# Patient Record
Sex: Female | Born: 1935 | Race: White | Hispanic: No | Smoking: Never smoker
Health system: Southern US, Community
[De-identification: ages and names within clinical notes are randomized; demographics above are authoritative.]

## PROBLEM LIST (undated history)

## (undated) DIAGNOSIS — M1990 Unspecified osteoarthritis, unspecified site: Secondary | ICD-10-CM

## (undated) DIAGNOSIS — R413 Other amnesia: Secondary | ICD-10-CM

## (undated) DIAGNOSIS — I639 Cerebral infarction, unspecified: Secondary | ICD-10-CM

## (undated) DIAGNOSIS — H3530 Unspecified macular degeneration: Secondary | ICD-10-CM

## (undated) DIAGNOSIS — M810 Age-related osteoporosis without current pathological fracture: Secondary | ICD-10-CM

## (undated) DIAGNOSIS — I4891 Unspecified atrial fibrillation: Secondary | ICD-10-CM

## (undated) DIAGNOSIS — I2510 Atherosclerotic heart disease of native coronary artery without angina pectoris: Secondary | ICD-10-CM

## (undated) DIAGNOSIS — I10 Essential (primary) hypertension: Secondary | ICD-10-CM

## (undated) DIAGNOSIS — R4701 Aphasia: Secondary | ICD-10-CM

## (undated) DIAGNOSIS — E785 Hyperlipidemia, unspecified: Secondary | ICD-10-CM

## (undated) DIAGNOSIS — I700 Atherosclerosis of aorta: Secondary | ICD-10-CM

## (undated) DIAGNOSIS — R32 Unspecified urinary incontinence: Secondary | ICD-10-CM

## (undated) DIAGNOSIS — K219 Gastro-esophageal reflux disease without esophagitis: Secondary | ICD-10-CM

## (undated) HISTORY — DX: Atherosclerotic heart disease of native coronary artery without angina pectoris: I25.10

## (undated) HISTORY — DX: Other amnesia: R41.3

## (undated) HISTORY — DX: Hyperlipidemia, unspecified: E78.5

## (undated) HISTORY — DX: Atherosclerosis of aorta: I70.0

## (undated) HISTORY — DX: Essential (primary) hypertension: I10

## (undated) HISTORY — DX: Age-related osteoporosis without current pathological fracture: M81.0

## (undated) HISTORY — DX: Unspecified osteoarthritis, unspecified site: M19.90

## (undated) HISTORY — DX: Unspecified macular degeneration: H35.30

## (undated) HISTORY — PX: TONSILLECTOMY: SUR1361

## (undated) HISTORY — DX: Cerebral infarction, unspecified: I63.9

## (undated) HISTORY — DX: Gastro-esophageal reflux disease without esophagitis: K21.9

## (undated) HISTORY — DX: Unspecified atrial fibrillation: I48.91

## (undated) HISTORY — DX: Aphasia: R47.01

## (undated) HISTORY — DX: Unspecified urinary incontinence: R32

## (undated) HISTORY — PX: OTHER SURGICAL HISTORY: SHX169

---

## 2003-12-26 ENCOUNTER — Encounter: Payer: Self-pay | Admitting: Cardiology

## 2014-02-08 DIAGNOSIS — I639 Cerebral infarction, unspecified: Secondary | ICD-10-CM

## 2014-02-08 HISTORY — DX: Cerebral infarction, unspecified: I63.9

## 2019-06-24 ENCOUNTER — Other Ambulatory Visit: Payer: Self-pay | Admitting: Family Medicine

## 2019-06-24 DIAGNOSIS — Z1231 Encounter for screening mammogram for malignant neoplasm of breast: Secondary | ICD-10-CM

## 2019-08-18 NOTE — Progress Notes (Signed)
Referring-Shannon Masneri DO Reason for referral-dyspnea  HPI: 85 year old female for evaluation of dyspnea at request of Crissie Sickles DO.  Patient previously lived in [place] and then [place].  I have no records available.  She apparently has a history of atrial fibrillation.  She denies any symptoms but her son states that she has diaphoresis and dyspnea with activities.  No orthopnea, PND, pedal edema, chest pain, palpitations, syncope or bleeding.  Current Outpatient Medications  Medication Sig Dispense Refill  . aspirin EC 81 MG tablet Take 81 mg by mouth daily.    Marland Kitchen latanoprost (XALATAN) 0.005 % ophthalmic solution     . metoprolol tartrate (LOPRESSOR) 50 MG tablet Take 50 mg by mouth 2 (two) times daily.    . Omega-3 Fatty Acids (FISH OIL) 1000 MG CAPS Take by mouth.    Marland Kitchen omeprazole (PRILOSEC) 20 MG capsule Take 20 mg by mouth daily.    Marland Kitchen oxybutynin (DITROPAN) 5 MG tablet     . simvastatin (ZOCOR) 40 MG tablet Take 40 mg by mouth at bedtime.    . timolol (TIMOPTIC) 0.5 % ophthalmic solution 1 drop 2 (two) times daily.    . vitamin B-12 (CYANOCOBALAMIN) 100 MCG tablet Take 100 mcg by mouth daily.    . vitamin E (VITAMIN E) 1000 UNIT capsule Take 1,000 Units by mouth daily.    Alveda Reasons 15 MG TABS tablet Take 15 mg by mouth daily.     No current facility-administered medications for this visit.    Allergies  Allergen Reactions  . Iodine Hives     Past Medical History:  Diagnosis Date  . Atrial fibrillation ([place])   . CVA (cerebral vascular accident) ([place])   . Hypertension     Past Surgical History:  Procedure Laterality Date  . TONSILLECTOMY    . Varicose veins      Social History   Socioeconomic History  . Marital status: Divorced    Spouse name: Not on file  . Number of children: 10  . Years of education: Not on file  . Highest education level: Not on file  Occupational History  . Not on file  Tobacco Use  . Smoking status: Never Smoker  .  Smokeless tobacco: Never Used  Substance and Sexual Activity  . Alcohol use: Yes    Comment: Rare  . Drug use: Not on file  . Sexual activity: Not on file  Other Topics Concern  . Not on file  Social History Narrative  . Not on file   Social Determinants of Health   Financial Resource Strain:   . Difficulty of Paying Living Expenses: Not on file  Food Insecurity:   . Worried About Charity fundraiser in the Last Year: Not on file  . Ran Out of Food in the Last Year: Not on file  Transportation Needs:   . Lack of Transportation (Medical): Not on file  . Lack of Transportation (Non-Medical): Not on file  Physical Activity:   . Days of Exercise per Week: Not on file  . Minutes of Exercise per Session: Not on file  Stress:   . Feeling of Stress : Not on file  Social Connections:   . Frequency of Communication with Friends and Family: Not on file  . Frequency of Social Gatherings with Friends and Family: Not on file  . Attends Religious Services: Not on file  . Active Member of Clubs or Organizations: Not on file  . Attends Archivist Meetings:  Not on file  . Marital Status: Not on file  Intimate Partner Violence:   . Fear of Current or Ex-Partner: Not on file  . Emotionally Abused: Not on file  . Physically Abused: Not on file  . Sexually Abused: Not on file    Family History  Problem Relation Age of Onset  . Stroke Father     ROS: no fevers or chills, productive cough, hemoptysis, dysphasia, odynophagia, melena, hematochezia, dysuria, hematuria, rash, seizure activity, orthopnea, PND, pedal edema, claudication. Remaining systems are negative.  Physical Exam:   Blood pressure (!) 141/70, pulse (!) 42, temperature (!) 97 F (36.1 C), height 5' 5.5" (1.664 m), weight 187 lb (84.8 kg), SpO2 95 %.  General:  Well developed/well nourished in NAD Skin warm/dry Patient not depressed No peripheral clubbing Back-normal HEENT-normal/normal eyelids Neck  supple/normal carotid upstroke bilaterally; no bruits; no JVD; no thyromegaly chest - CTA/ normal expansion CV -irregular and bradycardic/normal S1 and S2; no murmurs, rubs or gallops;  PMI nondisplaced Abdomen -NT/ND, no HSM, no mass, + bowel sounds, no bruit 2+ femoral pulses, no bruits Ext-no edema, chords, 2+ DP Neuro-grossly nonfocal  ECG -atrial fibrillation, left axis deviation, cannot rule out septal infarct, occasional PVC or ventricular escape beat.  Personally reviewed  A/P  1 dyspnea-patient has had some dyspnea on exertion per her son's report.  We will arrange an echocardiogram to assess LV function.  2 atrial fibrillation-duration unknown but likely permanent.  We will obtain all records from her previous cardiologist in [place].  Her heart rate is slow today and in the 40s.  Decrease metoprolol to 25 mg twice daily for 3 days then discontinue.  I will then arrange a 24-hour Holter monitor to assess heart rate control.  Continue Xarelto.  Check hemoglobin and renal function.  Note patient is also on aspirin.  We will review outside records to see if this is indicated.  3 hypertension-blood pressure mildly elevated.  We are also discontinuing metoprolol.  I will add amlodipine 5 mg daily once metoprolol has been discontinued.  Follow blood pressure and adjust regimen as needed.  4 hyperlipidemia-continue statin.  Check lipids and liver.  Kirk Ruths, MD

## 2019-08-24 ENCOUNTER — Encounter (INDEPENDENT_AMBULATORY_CARE_PROVIDER_SITE_OTHER): Payer: Self-pay

## 2019-08-24 ENCOUNTER — Other Ambulatory Visit: Payer: Self-pay

## 2019-08-24 ENCOUNTER — Ambulatory Visit: Payer: Self-pay | Admitting: Cardiology

## 2019-08-24 ENCOUNTER — Encounter: Payer: Self-pay | Admitting: Cardiology

## 2019-08-24 VITALS — BP 141/70 | HR 42 | Temp 97.0°F | Ht 65.5 in | Wt 187.0 lb

## 2019-08-24 DIAGNOSIS — R0609 Other forms of dyspnea: Secondary | ICD-10-CM

## 2019-08-24 DIAGNOSIS — E7800 Pure hypercholesterolemia, unspecified: Secondary | ICD-10-CM | POA: Diagnosis not present

## 2019-08-24 DIAGNOSIS — I4821 Permanent atrial fibrillation: Secondary | ICD-10-CM

## 2019-08-24 DIAGNOSIS — R0600 Dyspnea, unspecified: Secondary | ICD-10-CM | POA: Diagnosis not present

## 2019-08-24 DIAGNOSIS — I10 Essential (primary) hypertension: Secondary | ICD-10-CM | POA: Diagnosis not present

## 2019-08-24 LAB — BASIC METABOLIC PANEL
BUN/Creatinine Ratio: 24 (ref 12–28)
BUN: 21 mg/dL (ref 8–27)
CO2: 25 mmol/L (ref 20–29)
Calcium: 9.6 mg/dL (ref 8.7–10.3)
Chloride: 100 mmol/L (ref 96–106)
Creatinine, Ser: 0.88 mg/dL (ref 0.57–1.00)
GFR calc Af Amer: 68 mL/min/{1.73_m2} (ref >59)
GFR calc non Af Amer: 59 mL/min/{1.73_m2} — ABNORMAL LOW (ref >59)
Glucose: 111 mg/dL — ABNORMAL HIGH (ref 65–99)
Potassium: 5.3 mmol/L — ABNORMAL HIGH (ref 3.5–5.2)
Sodium: 139 mmol/L (ref 134–144)

## 2019-08-24 LAB — HEPATIC FUNCTION PANEL
ALT: 17 IU/L (ref 0–32)
AST: 21 IU/L (ref 0–40)
Albumin: 4.1 g/dL (ref 3.6–4.6)
Alkaline Phosphatase: 79 IU/L (ref 39–117)
Bilirubin Total: 0.6 mg/dL (ref 0.0–1.2)
Bilirubin, Direct: 0.19 mg/dL (ref 0.00–0.40)
Total Protein: 6.1 g/dL (ref 6.0–8.5)

## 2019-08-24 LAB — LIPID PANEL
Chol/HDL Ratio: 2.5 ratio (ref 0.0–4.4)
Cholesterol, Total: 151 mg/dL (ref 100–199)
HDL: 61 mg/dL (ref >39)
LDL Chol Calc (NIH): 66 mg/dL (ref 0–99)
Triglycerides: 143 mg/dL (ref 0–149)
VLDL Cholesterol Cal: 24 mg/dL (ref 5–40)

## 2019-08-24 LAB — CBC
Hematocrit: 42.8 % (ref 34.0–46.6)
Hemoglobin: 14.8 g/dL (ref 11.1–15.9)
MCH: 31 pg (ref 26.6–33.0)
MCHC: 34.6 g/dL (ref 31.5–35.7)
MCV: 90 fL (ref 79–97)
Platelets: 327 10*3/uL (ref 150–450)
RBC: 4.78 x10E6/uL (ref 3.77–5.28)
RDW: 12.3 % (ref 11.7–15.4)
WBC: 8.6 10*3/uL (ref 3.4–10.8)

## 2019-08-24 MED ORDER — AMLODIPINE BESYLATE 5 MG PO TABS: 5.0000 mg | ORAL_TABLET | Freq: Every day | ORAL | 3 refills | Status: DC

## 2019-08-24 NOTE — Patient Instructions (Signed)
Medication Instructions:  DECREASE METOPROLOL TO 25 MG TWICE DAILY FOR 3 DAYS AND THEN STOP= 1/2 OF THE 50 MG TABLET TWICE DAILY FOR 3 DAYS AND THEN STOP  START AMLODIPINE 5 MG ONCE DAILY AFTER METOPROLOL STOPPED  *If you need a refill on your cardiac medications before your next appointment, please call your pharmacy*  Lab Work: Your physician recommends that you HAVE LAB WORK TODAY  If you have labs (blood work) drawn today and your tests are completely normal, you will receive your results only by: Marland Kitchen MyChart Message (if you have MyChart) OR . A paper copy in the mail If you have any lab test that is abnormal or we need to change your treatment, we will call you to review the results.  Testing/Procedures: Your physician has requested that you have an echocardiogram. Echocardiography is a painless test that uses sound waves to create images of your heart. It provides your doctor with information about the size and shape of your heart and how well your heart's chambers and valves are working. This procedure takes approximately one hour. There are no restrictions for this procedure.AT THE [facility]   ZIO XT- Long Term Monitor Instructions   Your physician has requested you wear your ZIO patch monitor__24_____HOURS. MAIL IN 2 WEEKS   This is a single patch monitor.  [employer] supplies one patch monitor per enrollment.  Additional stickers are not available.   Please do not apply patch if you will be having a Nuclear Stress Test, Echocardiogram, Cardiac CT, MRI, or Chest Xray during the time frame you would be wearing the monitor. The patch cannot be worn during these tests.  You cannot remove and re-apply the ZIO XT patch monitor.   Your ZIO patch monitor will be sent USPS Priority mail from [facility] directly to your home address. The monitor may also be mailed to a PO BOX if home delivery is not available.   It may take 3-5 days to receive your monitor after you have been  enrolled.   Once you have received you monitor, please review enclosed instructions.  Your monitor has already been registered assigning a specific monitor serial # to you.   Applying the monitor   Shave hair from upper left chest.   Hold abrader disc by orange tab.  Rub abrader in 40 strokes over left upper chest as indicated in your monitor instructions.   Clean area with 4 enclosed alcohol pads .  Use all pads to assure are is cleaned thoroughly.  Let dry.   Apply patch as indicated in monitor instructions.  Patch will be place under collarbone on left side of chest with arrow pointing upward.   Rub patch adhesive wings for 2 minutes.Remove white label marked "1".  Remove white label marked "2".  Rub patch adhesive wings for 2 additional minutes.   While looking in a mirror, press and release button in center of patch.  A small green light will flash 3-4 times .  This will be your only indicator the monitor has been turned on.     Do not shower for the first 24 hours.  You may shower after the first 24 hours.   Press button if you feel a symptom. You will hear a small click.  Record Date, Time and Symptom in the Patient Log Book.   When you are ready to remove patch, follow instructions on last 2 pages of Patient Log Book.  Stick patch monitor onto last page  of Patient Log Book.   Place Patient Log Book in [place] box.  Use locking tab on box and tape box closed securely.  The [employer] and [employer] has [employer] on it.  Please place in mailbox as soon as possible.  Your physician should have your test results approximately 7 days after the monitor has been mailed back to [facility].   Call [place] at [phone] if you have questions regarding your ZIO XT patch monitor.  Call them immediately if you see an orange light blinking on your monitor.   If your monitor falls off in less than 4 days contact our Monitor department at [phone].  If  your monitor becomes loose or falls off after 4 days call [employer] at [phone] for suggestions on securing your monitor.    Follow-Up: At [facility], you and your health needs are our priority.  As part of our continuing mission to provide you with exceptional heart care, we have created designated Provider Care Teams.  These Care Teams include your primary Cardiologist (physician) and Advanced Practice Providers (APPs -  Physician Assistants and Nurse Practitioners) who all work together to provide you with the care you need, when you need it.  Your next appointment:   Your physician recommends that you schedule a follow-up appointment in: [place]  Your physician recommends that you schedule a follow-up appointment in: [place]

## 2019-08-27 ENCOUNTER — Telehealth: Payer: Self-pay | Admitting: *Deleted

## 2019-08-27 ENCOUNTER — Encounter: Payer: Self-pay | Admitting: *Deleted

## 2019-08-27 ENCOUNTER — Ambulatory Visit (HOSPITAL_BASED_OUTPATIENT_CLINIC_OR_DEPARTMENT_OTHER)
Admission: RE | Admit: 2019-08-27 | Discharge: 2019-08-27 | Disposition: A | Discharge status: Home / Self Care | Payer: Medicare Other | Source: Ambulatory Visit | Attending: Cardiology | Admitting: Cardiology

## 2019-08-27 ENCOUNTER — Other Ambulatory Visit: Payer: Self-pay

## 2019-08-27 DIAGNOSIS — I4821 Permanent atrial fibrillation: Secondary | ICD-10-CM | POA: Insufficient documentation

## 2019-08-27 NOTE — Progress Notes (Signed)
Patient ID: Rebecca Cannon, female   DOB: 1929-08-16, 85 y.o.   MRN: [id number] Patient enrolled for [employer] to mail a 3 day ZIO XT long term holter monitor to her home.

## 2019-08-27 NOTE — Telephone Encounter (Addendum)
Discussed with patients son, he will contact us after the first of the year when a new script is needed.  ----- Message from Lelon Perla, MD sent at 08/24/2019  5:07 PM EST ----- Once Xarelto expires will discontinue and treat with apixaban 5 mg twice daily. Kirk Ruths

## 2019-08-27 NOTE — Progress Notes (Signed)
  Echocardiogram 2D Echocardiogram has been performed.  Cardell Peach 08/27/2019, 11:10 AM

## 2019-08-31 ENCOUNTER — Telehealth: Payer: Self-pay | Admitting: Cardiology

## 2019-08-31 MED ORDER — LOSARTAN POTASSIUM 50 MG PO TABS: 50.0000 mg | ORAL_TABLET | Freq: Every day | ORAL | 3 refills | Status: DC

## 2019-08-31 NOTE — Telephone Encounter (Signed)
Pt c/o medication issue:  1. Name of Medication:amLODipine (NORVASC) 5 MG tablet  2. How are you currently taking this medication (dosage and times per day)? Once a day  3. Are you having a reaction (difficulty breathing--STAT)? no  4. What is your medication issue? Patient states she is having very bad heartburn. She would like to know if she can switch back to metoprolol.

## 2019-08-31 NOTE — Telephone Encounter (Signed)
Pt son called and states that je os still waiting for a response regarding her Norvasc. Pt reports heartburn since she has been on this medications wants to know of she can switch pack to metoprolol. Son reports that he would like a response today of possible.

## 2019-08-31 NOTE — Telephone Encounter (Signed)
Spoke with pt son, Aware of dr Jacalyn Lefevre recommendations. New script sent to the pharmacy

## 2019-08-31 NOTE — Telephone Encounter (Signed)
Cannot resume metoprolol as patient's heart rate was too slow.  If amlodipine is causing problems would discontinue and try losartan 50 mg daily.  Check potassium and renal function in 1 week. Rebecca Cannon

## 2019-09-05 ENCOUNTER — Ambulatory Visit (INDEPENDENT_AMBULATORY_CARE_PROVIDER_SITE_OTHER): Payer: Medicare Other

## 2019-09-05 DIAGNOSIS — I4821 Permanent atrial fibrillation: Secondary | ICD-10-CM | POA: Diagnosis not present

## 2019-09-18 ENCOUNTER — Telehealth: Payer: Self-pay | Admitting: Cardiology

## 2019-09-18 NOTE — Telephone Encounter (Signed)
Stay off metoprolol Kirk Ruths

## 2019-09-18 NOTE — Telephone Encounter (Signed)
Spoke with Tammy with [employer]  Patient had slow AFib at rate 39bpm for 60 secs on 12/28 @ 3:18am. She was in AFib for the duration of the monitor.   Monitor report has been posted.   Will routed to MD/RN

## 2019-09-18 NOTE — Telephone Encounter (Signed)
Spoke with pt, she is no longer taking metoprolol. Spoke with pt son, the patient does not want to change to eliquis.

## 2019-09-18 NOTE — Telephone Encounter (Signed)
  [employer] is calling to report findings from patients monitor

## 2019-09-21 ENCOUNTER — Ambulatory Visit: Payer: Self-pay | Admitting: Family

## 2019-09-22 ENCOUNTER — Ambulatory Visit: Payer: Self-pay | Admitting: Cardiology

## 2019-11-17 NOTE — Progress Notes (Signed)
HPI: Follow-up dyspnea. Patient previously lived in [place] and then [place]. I have no records available. She apparently has a history of atrial fibrillation. Echocardiogram December 2020 showed normal LV function, mild right ventricular enlargement, biatrial enlargement, mild aortic and mitral regurgitation. Monitor January 2021 showed atrial fibrillation with PVCs or aberrantly conducted beats rate mildly decreased. Since last seen she denies dyspnea, chest pain, palpitations or syncope.  No bleeding.  Current Outpatient Medications  Medication Sig Dispense Refill  . aspirin EC 81 MG tablet Take 81 mg by mouth daily.    Marland Kitchen latanoprost (XALATAN) 0.005 % ophthalmic solution     . losartan (COZAAR) 50 MG tablet Take 1 tablet (50 mg total) by mouth daily. 90 tablet 3  . Omega-3 Fatty Acids (FISH OIL) 1000 MG CAPS Take by mouth.    Marland Kitchen omeprazole (PRILOSEC) 20 MG capsule Take 20 mg by mouth daily.    Marland Kitchen oxybutynin (DITROPAN) 5 MG tablet     . simvastatin (ZOCOR) 40 MG tablet Take 40 mg by mouth at bedtime.    . timolol (TIMOPTIC) 0.5 % ophthalmic solution 1 drop 2 (two) times daily.    . vitamin B-12 (CYANOCOBALAMIN) 100 MCG tablet Take 100 mcg by mouth daily.    . vitamin E (VITAMIN E) 1000 UNIT capsule Take 1,000 Units by mouth daily.    Alveda Reasons 15 MG TABS tablet Take 15 mg by mouth daily.     No current facility-administered medications for this visit.     Past Medical History:  Diagnosis Date  . Atrial fibrillation ([place])   . CVA (cerebral vascular accident) ([place])   . Hypertension     Past Surgical History:  Procedure Laterality Date  . TONSILLECTOMY    . Varicose veins      Social History   Socioeconomic History  . Marital status: Divorced    Spouse name: Not on file  . Number of children: 10  . Years of education: Not on file  . Highest education level: Not on file  Occupational History  . Not on file  Tobacco Use  . Smoking status: Never Smoker  .  Smokeless tobacco: Never Used  Substance and Sexual Activity  . Alcohol use: Yes    Comment: Rare  . Drug use: Not on file  . Sexual activity: Not on file  Other Topics Concern  . Not on file  Social History Narrative  . Not on file   Social Determinants of Health   Financial Resource Strain:   . Difficulty of Paying Living Expenses:   Food Insecurity:   . Worried About Charity fundraiser in the Last Year:   . Arboriculturist in the Last Year:   Transportation Needs:   . Film/video editor (Medical):   Marland Kitchen Lack of Transportation (Non-Medical):   Physical Activity:   . Days of Exercise per Week:   . Minutes of Exercise per Session:   Stress:   . Feeling of Stress :   Social Connections:   . Frequency of Communication with Friends and Family:   . Frequency of Social Gatherings with Friends and Family:   . Attends Religious Services:   . Active Member of Clubs or Organizations:   . Attends Archivist Meetings:   Marland Kitchen Marital Status:   Intimate Partner Violence:   . Fear of Current or Ex-Partner:   . Emotionally Abused:   Marland Kitchen Physically Abused:   . Sexually Abused:  Family History  Problem Relation Age of Onset  . Stroke Father     ROS: no fevers or chills, productive cough, hemoptysis, dysphasia, odynophagia, melena, hematochezia, dysuria, hematuria, rash, seizure activity, orthopnea, PND, pedal edema, claudication. Remaining systems are negative.  Physical Exam: Well-developed well-nourished in no acute distress.  Skin is warm and dry.  HEENT is normal.  Neck is supple.  Chest is clear to auscultation with normal expansion.  Cardiovascular exam is irregular Abdominal exam nontender or distended. No masses palpated. Extremities show no edema. neuro grossly intact   A/P  1 dyspnea-echocardiogram shows normal LV function.  Not volume overloaded on examination.  2 permanent atrial fibrillation-recent monitor showed bradycardia.  Continue off of  metoprolol.  Continue Xarelto (GFR 59; increase to 20 mg daily); discontinue aspirin given need for Xarelto.  3 hypertension-patient's blood pressure is elevated at home.  Increase losartan to 100 mg daily.  Follow blood pressure and adjust regimen as needed.  Check potassium and renal function in 1 week.  4 hyperlipidemia-continue statin.  Kirk Ruths, MD

## 2019-11-30 ENCOUNTER — Ambulatory Visit: Payer: Medicare Other | Admitting: Cardiology

## 2019-11-30 ENCOUNTER — Other Ambulatory Visit: Payer: Self-pay

## 2019-11-30 ENCOUNTER — Encounter: Payer: Self-pay | Admitting: Cardiology

## 2019-11-30 VITALS — BP 142/86 | HR 51 | Ht 65.0 in | Wt 185.4 lb

## 2019-11-30 DIAGNOSIS — I4821 Permanent atrial fibrillation: Secondary | ICD-10-CM | POA: Diagnosis not present

## 2019-11-30 DIAGNOSIS — R0600 Dyspnea, unspecified: Secondary | ICD-10-CM | POA: Diagnosis not present

## 2019-11-30 DIAGNOSIS — I10 Essential (primary) hypertension: Secondary | ICD-10-CM | POA: Diagnosis not present

## 2019-11-30 DIAGNOSIS — E7800 Pure hypercholesterolemia, unspecified: Secondary | ICD-10-CM

## 2019-11-30 DIAGNOSIS — R0609 Other forms of dyspnea: Secondary | ICD-10-CM

## 2019-11-30 MED ORDER — LOSARTAN POTASSIUM 100 MG PO TABS: 100.0000 mg | ORAL_TABLET | Freq: Every day | ORAL | 3 refills | Status: DC

## 2019-11-30 MED ORDER — RIVAROXABAN 20 MG PO TABS: 20.0000 mg | ORAL_TABLET | Freq: Every day | ORAL | 3 refills | Status: DC

## 2019-11-30 NOTE — Patient Instructions (Signed)
Medication Instructions:  STOP ASPIRIN  INCREASE LOSARTAN TO 100 MG ONCE DAILY=2 OF THE 50 MG TABLETS ONCE DAILY  INCREASE XARELTO TO 20 MG ONCE DAILY WITH FOOD  *If you need a refill on your cardiac medications before your next appointment, please call your pharmacy*   Lab Work: Your physician recommends that you return for lab work in: [place]  If you have labs (blood work) drawn today and your tests are completely normal, you will receive your results only by: Marland Kitchen MyChart Message (if you have MyChart) OR . A paper copy in the mail If you have any lab test that is abnormal or we need to change your treatment, we will call you to review the results.   Follow-Up: At [facility], you and your health needs are our priority.  As part of our continuing mission to provide you with exceptional heart care, we have created designated Provider Care Teams.  These Care Teams include your primary Cardiologist (physician) and Advanced Practice Providers (APPs -  Physician Assistants and Nurse Practitioners) who all work together to provide you with the care you need, when you need it.  We recommend signing up for the patient portal called "MyChart".  Sign up information is provided on this After Visit Summary.  MyChart is used to connect with patients for Virtual Visits (Telemedicine).  Patients are able to view lab/test results, encounter notes, upcoming appointments, etc.  Non-urgent messages can be sent to your provider as well.   To learn more about what you can do with MyChart, go to [email or web address].    Your next appointment:   6 month(s)  The format for your next appointment:   Either In Person or Virtual  Provider:   You may see Kirk Ruths MD or one of the following Advanced Practice Providers on your designated Care Team:    Kerin Ransom, PA-C  [place], [place]  Coletta Memos, 

## 2019-12-15 LAB — BASIC METABOLIC PANEL
BUN/Creatinine Ratio: 19 (ref 12–28)
BUN: 16 mg/dL (ref 8–27)
CO2: 25 mmol/L (ref 20–29)
Calcium: 9.3 mg/dL (ref 8.7–10.3)
Chloride: 100 mmol/L (ref 96–106)
Creatinine, Ser: 0.86 mg/dL (ref 0.57–1.00)
GFR calc Af Amer: 70 mL/min/{1.73_m2} (ref >59)
GFR calc non Af Amer: 61 mL/min/{1.73_m2} (ref >59)
Glucose: 111 mg/dL — ABNORMAL HIGH (ref 65–99)
Potassium: 4.8 mmol/L (ref 3.5–5.2)
Sodium: 137 mmol/L (ref 134–144)

## 2020-02-23 ENCOUNTER — Other Ambulatory Visit: Payer: Self-pay | Admitting: Cardiology

## 2020-02-23 DIAGNOSIS — I4821 Permanent atrial fibrillation: Secondary | ICD-10-CM

## 2020-02-23 NOTE — Telephone Encounter (Signed)
New Message    *STAT* If patient is at the pharmacy, call can be transferred to refill team.   1. Which medications need to be refilled? (please list name of each medication and dose if known) losartan (COZAAR) 100 MG tablet  2. Which pharmacy/location (including street and city if local pharmacy) is medication to be sent to? [facility]   3. Do they need a 30 day or 90 day supply? [place]

## 2020-02-24 MED ORDER — LOSARTAN POTASSIUM 100 MG PO TABS: 100.0000 mg | ORAL_TABLET | Freq: Every day | ORAL | 2 refills | Status: DC

## 2020-10-10 DIAGNOSIS — L821 Other seborrheic keratosis: Secondary | ICD-10-CM | POA: Diagnosis not present

## 2020-10-10 DIAGNOSIS — D1801 Hemangioma of skin and subcutaneous tissue: Secondary | ICD-10-CM | POA: Diagnosis not present

## 2020-10-10 DIAGNOSIS — L820 Inflamed seborrheic keratosis: Secondary | ICD-10-CM | POA: Diagnosis not present

## 2020-10-24 DIAGNOSIS — H43813 Vitreous degeneration, bilateral: Secondary | ICD-10-CM | POA: Diagnosis not present

## 2020-10-24 DIAGNOSIS — H35033 Hypertensive retinopathy, bilateral: Secondary | ICD-10-CM | POA: Diagnosis not present

## 2020-10-24 DIAGNOSIS — H527 Unspecified disorder of refraction: Secondary | ICD-10-CM | POA: Diagnosis not present

## 2020-10-24 DIAGNOSIS — H353131 Nonexudative age-related macular degeneration, bilateral, early dry stage: Secondary | ICD-10-CM | POA: Diagnosis not present

## 2020-10-24 DIAGNOSIS — H401132 Primary open-angle glaucoma, bilateral, moderate stage: Secondary | ICD-10-CM | POA: Diagnosis not present

## 2020-10-24 DIAGNOSIS — Z961 Presence of intraocular lens: Secondary | ICD-10-CM | POA: Diagnosis not present

## 2020-10-24 DIAGNOSIS — H11002 Unspecified pterygium of left eye: Secondary | ICD-10-CM | POA: Diagnosis not present

## 2020-11-02 ENCOUNTER — Other Ambulatory Visit: Payer: Self-pay | Admitting: Cardiology

## 2020-11-02 DIAGNOSIS — I4821 Permanent atrial fibrillation: Secondary | ICD-10-CM

## 2020-11-26 NOTE — Progress Notes (Signed)
HPI:  Follow-up atrial fibrillation and dyspnea. Patient previously lived in [place] and then [place]. I have no records available. She apparently has a history of atrial fibrillation. Echocardiogram December 2020 showed normal LV function, mild right ventricular enlargement, biatrial enlargement, mild aortic and mitral regurgitation. Monitor January 2021 showed atrial fibrillation with PVCs or aberrantly conducted beats rate mildly decreased. Since last seen  she occasionally has dyspnea on exertion but no orthopnea, PND or pedal edema.  No chest pain, palpitations, syncope or bleeding.  Current Outpatient Medications  Medication Sig Dispense Refill  . latanoprost (XALATAN) 0.005 % ophthalmic solution     . losartan (COZAAR) 100 MG tablet TAKE 1 TABLET BY MOUTH  DAILY 90 tablet 1  . Omega-3 Fatty Acids (FISH OIL) 1000 MG CAPS Take by mouth.    Marland Kitchen omeprazole (PRILOSEC) 20 MG capsule Take 20 mg by mouth daily.    Marland Kitchen oxybutynin (DITROPAN) 5 MG tablet     . rivaroxaban (XARELTO) 20 MG TABS tablet Take 1 tablet (20 mg total) by mouth daily with supper. 90 tablet 3  . simvastatin (ZOCOR) 40 MG tablet Take 40 mg by mouth at bedtime.    . timolol (TIMOPTIC) 0.5 % ophthalmic solution 1 drop 2 (two) times daily.    . vitamin B-12 (CYANOCOBALAMIN) 100 MCG tablet Take 100 mcg by mouth daily.    . vitamin E 1000 UNIT capsule Take 1,000 Units by mouth daily.     No current facility-administered medications for this visit.     Past Medical History:  Diagnosis Date  . Atrial fibrillation ([place])   . CVA (cerebral vascular accident) ([place])   . Hypertension     Past Surgical History:  Procedure Laterality Date  . TONSILLECTOMY    . Varicose veins      Social History   Socioeconomic History  . Marital status: Divorced    Spouse name: Not on file  . Number of children: 10  . Years of education: Not on file  . Highest education level: Not on file  Occupational History  . Not on file   Tobacco Use  . Smoking status: Never Smoker  . Smokeless tobacco: Never Used  Substance and Sexual Activity  . Alcohol use: Yes    Comment: Rare  . Drug use: Not on file  . Sexual activity: Not on file  Other Topics Concern  . Not on file  Social History Narrative  . Not on file   Social Determinants of Health   Financial Resource Strain: Not on file  Food Insecurity: Not on file  Transportation Needs: Not on file  Physical Activity: Not on file  Stress: Not on file  Social Connections: Not on file  Intimate Partner Violence: Not on file    Family History  Problem Relation Age of Onset  . Stroke Father     ROS: no fevers or chills, productive cough, hemoptysis, dysphasia, odynophagia, melena, hematochezia, dysuria, hematuria, rash, seizure activity, orthopnea, PND, pedal edema, claudication. Remaining systems are negative.  Physical Exam: Well-developed well-nourished in no acute distress.  Skin is warm and dry.  HEENT is normal.  Neck is supple.  Chest is clear to auscultation with normal expansion.  Cardiovascular exam is irregular Abdominal exam nontender or distended. No masses palpated. Extremities show no edema. neuro grossly intact  ECG-atrial fibrillation with PVCs or aberrantly conducted beats.  Personally reviewed  A/P  1 permanent atrial fibrillation-previous monitor showed bradycardia and we have discontinued metoprolol.  Continue Xarelto.  Check hemoglobin and renal function.  2 hypertension-blood pressure controlled.  Continue present medications and follow.  3 hyperlipidemia-continue statin.  4 dyspnea-previous echocardiogram showed normal LV function and she is not volume overloaded on examination.  Kirk Ruths, MD

## 2020-11-29 ENCOUNTER — Other Ambulatory Visit: Payer: Self-pay

## 2020-11-29 ENCOUNTER — Encounter: Payer: Self-pay | Admitting: Cardiology

## 2020-11-29 ENCOUNTER — Ambulatory Visit: Payer: Medicare Other | Admitting: Cardiology

## 2020-11-29 VITALS — BP 136/78 | HR 68 | Ht 65.0 in | Wt 181.0 lb

## 2020-11-29 DIAGNOSIS — I4821 Permanent atrial fibrillation: Secondary | ICD-10-CM | POA: Diagnosis not present

## 2020-11-29 DIAGNOSIS — E7800 Pure hypercholesterolemia, unspecified: Secondary | ICD-10-CM | POA: Diagnosis not present

## 2020-11-29 DIAGNOSIS — I10 Essential (primary) hypertension: Secondary | ICD-10-CM | POA: Diagnosis not present

## 2020-11-29 NOTE — Patient Instructions (Signed)

## 2020-12-12 DIAGNOSIS — H6123 Impacted cerumen, bilateral: Secondary | ICD-10-CM | POA: Diagnosis not present

## 2020-12-12 DIAGNOSIS — H9190 Unspecified hearing loss, unspecified ear: Secondary | ICD-10-CM | POA: Diagnosis not present

## 2020-12-14 ENCOUNTER — Other Ambulatory Visit: Payer: Self-pay | Admitting: Cardiology

## 2020-12-14 DIAGNOSIS — I4821 Permanent atrial fibrillation: Secondary | ICD-10-CM

## 2020-12-14 NOTE — Telephone Encounter (Signed)
40f, 82.1kg, Creatinine, Serum 0.860 mg/ 07/05/2020, ccr 57, lovw/crenshaw 11/29/20

## 2020-12-25 DIAGNOSIS — I10 Essential (primary) hypertension: Secondary | ICD-10-CM | POA: Diagnosis not present

## 2020-12-25 DIAGNOSIS — I4891 Unspecified atrial fibrillation: Secondary | ICD-10-CM | POA: Diagnosis not present

## 2020-12-25 DIAGNOSIS — R5383 Other fatigue: Secondary | ICD-10-CM | POA: Diagnosis not present

## 2020-12-25 DIAGNOSIS — R059 Cough, unspecified: Secondary | ICD-10-CM | POA: Diagnosis not present

## 2020-12-25 DIAGNOSIS — R918 Other nonspecific abnormal finding of lung field: Secondary | ICD-10-CM | POA: Diagnosis not present

## 2020-12-25 DIAGNOSIS — Z8673 Personal history of transient ischemic attack (TIA), and cerebral infarction without residual deficits: Secondary | ICD-10-CM | POA: Diagnosis not present

## 2020-12-25 DIAGNOSIS — I2510 Atherosclerotic heart disease of native coronary artery without angina pectoris: Secondary | ICD-10-CM | POA: Diagnosis not present

## 2020-12-25 DIAGNOSIS — Z79899 Other long term (current) drug therapy: Secondary | ICD-10-CM | POA: Diagnosis not present

## 2020-12-25 DIAGNOSIS — I700 Atherosclerosis of aorta: Secondary | ICD-10-CM | POA: Diagnosis not present

## 2020-12-25 DIAGNOSIS — R0789 Other chest pain: Secondary | ICD-10-CM | POA: Diagnosis not present

## 2020-12-25 DIAGNOSIS — J984 Other disorders of lung: Secondary | ICD-10-CM | POA: Diagnosis not present

## 2020-12-25 DIAGNOSIS — N3000 Acute cystitis without hematuria: Secondary | ICD-10-CM | POA: Diagnosis not present

## 2020-12-25 DIAGNOSIS — Z7901 Long term (current) use of anticoagulants: Secondary | ICD-10-CM | POA: Diagnosis not present

## 2020-12-25 DIAGNOSIS — Z20822 Contact with and (suspected) exposure to covid-19: Secondary | ICD-10-CM | POA: Diagnosis not present

## 2020-12-25 DIAGNOSIS — Z91041 Radiographic dye allergy status: Secondary | ICD-10-CM | POA: Diagnosis not present

## 2020-12-26 DIAGNOSIS — J984 Other disorders of lung: Secondary | ICD-10-CM | POA: Diagnosis not present

## 2020-12-26 DIAGNOSIS — I700 Atherosclerosis of aorta: Secondary | ICD-10-CM | POA: Diagnosis not present

## 2020-12-26 DIAGNOSIS — I2510 Atherosclerotic heart disease of native coronary artery without angina pectoris: Secondary | ICD-10-CM | POA: Diagnosis not present

## 2021-01-03 ENCOUNTER — Telehealth: Payer: Self-pay | Admitting: Cardiology

## 2021-01-03 DIAGNOSIS — I10 Essential (primary) hypertension: Secondary | ICD-10-CM | POA: Diagnosis not present

## 2021-01-03 DIAGNOSIS — I4891 Unspecified atrial fibrillation: Secondary | ICD-10-CM | POA: Diagnosis not present

## 2021-01-03 DIAGNOSIS — R1110 Vomiting, unspecified: Secondary | ICD-10-CM | POA: Diagnosis not present

## 2021-01-03 DIAGNOSIS — Z7901 Long term (current) use of anticoagulants: Secondary | ICD-10-CM | POA: Diagnosis not present

## 2021-01-03 DIAGNOSIS — R413 Other amnesia: Secondary | ICD-10-CM | POA: Diagnosis not present

## 2021-01-03 DIAGNOSIS — R062 Wheezing: Secondary | ICD-10-CM | POA: Diagnosis not present

## 2021-01-03 NOTE — Telephone Encounter (Signed)
    Pt and her son calling, they are trying to schedule and appt with Dr. [name]. They were advise to call him due to her HR and BP. They were advised by pcp to call to make an appt, and ask if pt can get in sooner

## 2021-01-03 NOTE — Telephone Encounter (Signed)
Spoke to patient's son.He stated mother saw Ammie Dalton PA this morning.Stated he was concerned her heart rate was 50 and below.B/P elevated.He double her B/P meds.She has a UTI.Chest congested.He wanted her to be seen.Appointment scheduled with Almyra Deforest PA 5/6 at 11:45 am.  Spoke to [facility] PA he saw patient this morning.He was concerned her pulse 44 and 50.B/P 144/62.He would like her seen before 5/6 if possible.Stated she was not her normal self.I will send message to Dr.Crenshaw's RN for a sooner appointment.

## 2021-01-03 NOTE — Telephone Encounter (Signed)
Nww Message:    [name], the PA from [place], saw her this morning  In the office today  and her blood pressure was 144/62. Here are some recent numbers at home, they  were 190/101. 138/61, 203/110, these are from the past 48 hours. His major concern today was that her pulse was 50.When he listen to her heart today it sounded like she have Afib and premature heart beats.Overall pt did not seem like herself. Pt was told to contact Dr [name] and determine what the pt needs. He wanted to make sure you had this information from today's visit.

## 2021-01-04 MED ORDER — AMLODIPINE BESYLATE 5 MG PO TABS: 5.0000 mg | ORAL_TABLET | Freq: Every day | ORAL | 3 refills | Status: DC

## 2021-01-04 NOTE — Telephone Encounter (Addendum)
Spoke with pt son, aware will watch for cancellations. Aware the timolol eye drops the patient uses will directly affect her heart rate and recommended that she contact the perscriber to see if there is something else she can take. He was on the other line with a doctor so I will call him back.

## 2021-01-04 NOTE — Telephone Encounter (Signed)
Follow Up:      Pt son is calling you back, pt is still having problems. with her blood pressure.

## 2021-01-04 NOTE — Telephone Encounter (Signed)
Spoke with pt son, her eye drops have been changed. He is also concerned about her bp, 187/97, 191/106, 185/106 and her heart rate today is running 50-59 bpm. Confirmed she is taking losartan 100 mg once daily. She has a follow up appointment with the app 01/13/21. Will forward for dr [name] review

## 2021-01-04 NOTE — Telephone Encounter (Signed)
  Add amlodipine 5 mg daily and follow BP  [place] with pt son, aware of dr Jacalyn Lefevre recommendations. New script sent to the pharmacy

## 2021-01-13 ENCOUNTER — Encounter: Payer: Self-pay | Admitting: Physician Assistant

## 2021-01-13 ENCOUNTER — Ambulatory Visit: Payer: Medicare Other | Admitting: Physician Assistant

## 2021-01-13 ENCOUNTER — Other Ambulatory Visit: Payer: Self-pay

## 2021-01-13 VITALS — BP 150/84 | HR 79 | Ht 62.0 in | Wt 177.6 lb

## 2021-01-13 DIAGNOSIS — I10 Essential (primary) hypertension: Secondary | ICD-10-CM | POA: Diagnosis not present

## 2021-01-13 DIAGNOSIS — N390 Urinary tract infection, site not specified: Secondary | ICD-10-CM | POA: Diagnosis not present

## 2021-01-13 DIAGNOSIS — Z8673 Personal history of transient ischemic attack (TIA), and cerebral infarction without residual deficits: Secondary | ICD-10-CM

## 2021-01-13 DIAGNOSIS — I4821 Permanent atrial fibrillation: Secondary | ICD-10-CM

## 2021-01-13 NOTE — Patient Instructions (Signed)
Medication Instructions:  Per Almyra Deforest, PA continue on current amlodipine therapy for one week, if blood pressures continue to be 140s or greater systolic, double amlodipine dose to 10mg  daily.   *If you need a refill on your cardiac medications before your next appointment, please call your pharmacy*   Lab Work: Urinalysis  If you have labs (blood work) drawn today and your tests are completely normal, you will receive your results only by: Marland Kitchen MyChart Message (if you have MyChart) OR . A paper copy in the mail If you have any lab test that is abnormal or we need to change your treatment, we will call you to review the results.   Testing/Procedures: None ordered.    Follow-Up: At [facility], you and your health needs are our priority.  As part of our continuing mission to provide you with exceptional heart care, we have created designated Provider Care Teams.  These Care Teams include your primary Cardiologist (physician) and Advanced Practice Providers (APPs -  Physician Assistants and Nurse Practitioners) who all work together to provide you with the care you need, when you need it.  We recommend signing up for the patient portal called "MyChart".  Sign up information is provided on this After Visit Summary.  MyChart is used to connect with patients for Virtual Visits (Telemedicine).  Patients are able to view lab/test results, encounter notes, upcoming appointments, etc.  Non-urgent messages can be sent to your provider as well.   To learn more about what you can do with MyChart, go to [email or web address].    Your next appointment:   3 week(s)  The format for your next appointment:   In Person  Provider:   Kirk Ruths, MD

## 2021-01-13 NOTE — Progress Notes (Signed)
Cardiology Office Note:    Date:  01/15/2021   ID:  Rebecca Cannon, DOB 1929-05-27, MRN [id number]  PCP:  Jamie Kato   [place] Providers Cardiologist:  Kirk Ruths, MD {  Referring MD: Ramiro Harvest, PA-C   Chief Complaint  Patient presents with  . Follow-up    Seen for Dr. [name]    History of Present Illness:    Rebecca Cannon is a 87 y.o. female with a hx of hypertension, CVA and history of permanent atrial fibrillation.  Patient previously lived in [place] and then moved to [place].  Echocardiogram in December 2020 showed normal EF, mild right ventricular enlargement, biatrial enlargement, mild aortic and mitral regurgitation.  Prior monitoring in January 2021 showed atrial fibrillation with PVCs and aberrantly conducted heartbeat.  Due to bradycardia seen on the heart monitor, her metoprolol was discontinued.  She was last seen by Dr. [name] on 11/29/2020 at which time she was doing well.  She was to continue Xarelto.  Recently patient's son contacted cardiology service complaining of her heart rate being slow and blood pressure elevated.  Timolol eyedrops was discontinued.  She was started on amlodipine 5 mg daily.  Since started on amlodipine, her systolic blood pressure has been in the 140s.  She is being accompanied by her son during today's visit.  According to her son, she had quite significant episode of chills and a gagging episode prior to the recent ED visit in April.  Since then, her symptom has resolved, however he felt " her eyes has looked different" and wondered if she had a stroke.  I did a neuro exam on her which did not show any obvious focal deficit.  She is extremely hard of hearing and dependent on her son during today's interview.  I recommend continue to observe her blood pressure for 1 more week, if her systolic blood pressure still 140s or above, I will increase amlodipine to 10 mg daily.  However I would also recommend keep  amlodipine and losartan 12 hours apart to allow her blood pressure to be below stable as her son complained that her blood pressure is very labile.  I plan to bring the patient back in 3 weeks for reassessment.  Otherwise she denies any significant chest pain, lower extremity edema, orthopnea or PND.    Past Medical History:  Diagnosis Date  . Atrial fibrillation ([place])   . CVA (cerebral vascular accident) ([place])   . Hypertension     Past Surgical History:  Procedure Laterality Date  . TONSILLECTOMY    . Varicose veins      Current Medications: Current Meds  Medication Sig  . amLODipine (NORVASC) 5 MG tablet Take 1 tablet (5 mg total) by mouth daily.  Marland Kitchen losartan (COZAAR) 100 MG tablet TAKE 1 TABLET BY MOUTH  DAILY  . Omega-3 Fatty Acids (FISH OIL) 1000 MG CAPS Take by mouth.  Marland Kitchen omeprazole (PRILOSEC) 20 MG capsule Take 20 mg by mouth daily.  Marland Kitchen oxybutynin (DITROPAN) 5 MG tablet   . simvastatin (ZOCOR) 40 MG tablet Take 40 mg by mouth at bedtime.  . timolol (TIMOPTIC) 0.5 % ophthalmic solution 1 drop 2 (two) times daily.  . vitamin B-12 (CYANOCOBALAMIN) 100 MCG tablet Take 100 mcg by mouth daily.  . vitamin E 1000 UNIT capsule Take 1,000 Units by mouth daily.  Alveda Reasons 20 MG TABS tablet Take 1 tablet (20 mg total) by mouth daily with supper.  . [DISCONTINUED] latanoprost (XALATAN) 0.005 %  ophthalmic solution      Allergies:   Iodine   Social History   Socioeconomic History  . Marital status: Divorced    Spouse name: Not on file  . Number of children: 10  . Years of education: Not on file  . Highest education level: Not on file  Occupational History  . Not on file  Tobacco Use  . Smoking status: Never Smoker  . Smokeless tobacco: Never Used  Substance and Sexual Activity  . Alcohol use: Yes    Comment: Rare  . Drug use: Not on file  . Sexual activity: Not on file  Other Topics Concern  . Not on file  Social History Narrative  . Not on file   Social Determinants of  Health   Financial Resource Strain: Not on file  Food Insecurity: Not on file  Transportation Needs: Not on file  Physical Activity: Not on file  Stress: Not on file  Social Connections: Not on file     Family History: The patient's family history includes Stroke in her father.  ROS:   Please see the history of present illness.     All other systems reviewed and are negative.  EKGs/Labs/Other Studies Reviewed:    The following studies were reviewed today:  Echo 08/27/2019 1. Left ventricular ejection fraction, by visual estimation, is 55 to  60%. The left ventricle has normal function. Left ventricular septal wall  thickness was mildly increased. Normal left ventricular posterior wall  thickness. There is no left  ventricular hypertrophy.  2. The left ventricle has no regional wall motion abnormalities.  3. Global right ventricle has normal systolic function.The right  ventricular size is mildly enlarged. No increase in right ventricular wall  thickness.  4. Left atrial size was moderately dilated.  5. Right atrial size was mildly dilated.  6. Mild mitral annular calcification.  7. The mitral valve is degenerative. Mild mitral valve regurgitation. No  evidence of mitral stenosis.  8. The tricuspid valve is normal in structure. Tricuspid valve  regurgitation is mild.  9. The aortic valve is tricuspid. Aortic valve regurgitation is mild.  Mild aortic valve sclerosis without stenosis.  10. The pulmonic valve was normal in structure. Pulmonic valve  regurgitation is not visualized.  11. There is mild dilatation of the ascending aorta measuring 37 mm.  12. Normal pulmonary artery systolic pressure.  13. The inferior vena cava is normal in size with greater than 50%  respiratory variability, suggesting right atrial pressure of 3 mmHg.   EKG:  EKG is ordered today.  The ekg ordered today demonstrates atrial fibrillation, poor R wave progression in the anterior  leads.  Recent Labs: No results found for requested labs within last 8760 hours.  Recent Lipid Panel    Component Value Date/Time   CHOL 151 08/24/2019 1256   TRIG 143 08/24/2019 1256   HDL 61 08/24/2019 1256   CHOLHDL 2.5 08/24/2019 1256   LDLCALC 66 08/24/2019 1256     Risk Assessment/Calculations:    CHA2DS2-VASc Score = 6  This indicates a 9.7% annual risk of stroke. The patient's score is based upon: CHF History: No HTN History: Yes Diabetes History: No Stroke History: Yes Vascular Disease History: No Age Score: 2 Gender Score: 1      Physical Exam:    VS:  BP (!) 150/84   Pulse 79   Ht 5\' 2"  (1.575 m)   Wt 177 lb 9.6 oz (80.6 kg)   SpO2 97%  BMI 32.48 kg/m     Wt Readings from Last 3 Encounters:  01/13/21 177 lb 9.6 oz (80.6 kg)  11/29/20 181 lb (82.1 kg)  11/30/19 185 lb 6.4 oz (84.1 kg)     GEN:  Well nourished, well developed in no acute distress HEENT: Normal NECK: No JVD; No carotid bruits LYMPHATICS: No lymphadenopathy CARDIAC: Irregularly irregular, no murmurs, rubs, gallops RESPIRATORY:  Clear to auscultation without rales, wheezing or rhonchi  ABDOMEN: Soft, non-tender, non-distended MUSCULOSKELETAL:  No edema; No deformity  SKIN: Warm and dry NEUROLOGIC:  Alert and oriented x 3 PSYCHIATRIC:  Normal affect   ASSESSMENT:    1. Essential hypertension   2. Permanent atrial fibrillation ([place])   3. H/O: CVA (cerebrovascular accident)   4. Urinary tract infection without hematuria, site unspecified    PLAN:    In order of problems listed above:  1. Hypertension: Blood pressure continues to be elevated, he was started on amlodipine 5 mg a week ago, continue on the current therapy for 1 more week.  If systolic blood pressure is still greater than 140 mmHg, will further increase amlodipine to 10 mg daily  2. Permanent atrial fibrillation: Heart rate self-controlled on no AV nodal blocking agent.  Continue Xarelto  3. History of CVA: Her  son mentions her eye looks different recently, however I do not see any focal deficit on exam.  She is extremely hard of hearing.  4. Recent UTI: We will obtain urinalysis with culture.        Medication Adjustments/Labs and Tests Ordered: Current medicines are reviewed at length with the patient today.  Concerns regarding medicines are outlined above.  Orders Placed This Encounter  Procedures  . Microscopic Examination  . Urine Culture, Reflex  . UA/M w/rflx Culture, Routine  . EKG 12-Lead   No orders of the defined types were placed in this encounter.   Patient Instructions  Medication Instructions:  Per Almyra Deforest, PA continue on current amlodipine therapy for one week, if blood pressures continue to be 140s or greater systolic, double amlodipine dose to 10mg  daily.   *If you need a refill on your cardiac medications before your next appointment, please call your pharmacy*   Lab Work: Urinalysis  If you have labs (blood work) drawn today and your tests are completely normal, you will receive your results only by: Marland Kitchen MyChart Message (if you have MyChart) OR . A paper copy in the mail If you have any lab test that is abnormal or we need to change your treatment, we will call you to review the results.   Testing/Procedures: None ordered.    Follow-Up: At [facility], you and your health needs are our priority.  As part of our continuing mission to provide you with exceptional heart care, we have created designated Provider Care Teams.  These Care Teams include your primary Cardiologist (physician) and Advanced Practice Providers (APPs -  Physician Assistants and Nurse Practitioners) who all work together to provide you with the care you need, when you need it.  We recommend signing up for the patient portal called "MyChart".  Sign up information is provided on this After Visit Summary.  MyChart is used to connect with patients for Virtual Visits (Telemedicine).  Patients are  able to view lab/test results, encounter notes, upcoming appointments, etc.  Non-urgent messages can be sent to your provider as well.   To learn more about what you can do with MyChart, go to [email or web address].    Your  next appointment:   3 week(s)  The format for your next appointment:   In Person  Provider:   Kirk Ruths, MD        Signed, Almyra Deforest, [place]  01/15/2021 8:50 PM    [place]

## 2021-01-15 ENCOUNTER — Encounter: Payer: Self-pay | Admitting: Physician Assistant

## 2021-01-16 ENCOUNTER — Other Ambulatory Visit: Payer: Self-pay | Admitting: Physician Assistant

## 2021-01-16 MED ORDER — CEFDINIR 300 MG PO CAPS: 300.0000 mg | ORAL_CAPSULE | Freq: Two times a day (BID) | ORAL | 0 refills | Status: AC

## 2021-01-16 NOTE — Progress Notes (Signed)
Spoke with son, previously prescribed Keflex by [facility] ED. Based on there culture and sensitivity panel for e coli, we will prescribe 3rd gen cephalosporin Cefdinir 300mg  twice a day for 10 days for her UTI which has better Gram Negative coverage for e coli. Rx has been sent to her pharmacy

## 2021-01-16 NOTE — Progress Notes (Signed)
Initial UA positive for UTI, culture is still pending, I would like to start on another course of antibiotics for [place], please verify with the patient's son to see which antibiotics she took previously for UTI as I would like to use something different.

## 2021-01-20 LAB — UA/M W/RFLX CULTURE, ROUTINE
Bilirubin, UA: NEGATIVE
Glucose, UA: NEGATIVE
Nitrite, UA: POSITIVE — AB
RBC, UA: NEGATIVE
Specific Gravity, UA: 1.022 (ref 1.005–1.030)
Urobilinogen, Ur: 0.2 mg/dL (ref 0.2–1.0)
pH, UA: 5.5 (ref 5.0–7.5)

## 2021-01-20 LAB — URINE CULTURE, REFLEX

## 2021-01-20 LAB — MICROSCOPIC EXAMINATION
Casts: NONE SEEN /lpf
Epithelial Cells (non renal): >10 /hpf — AB (ref 0–10)
RBC, Urine: NONE SEEN /hpf (ref 0–2)
WBC, UA: >30 /hpf — AB (ref 0–5)

## 2021-01-24 DIAGNOSIS — D6869 Other thrombophilia: Secondary | ICD-10-CM | POA: Diagnosis not present

## 2021-01-24 DIAGNOSIS — K219 Gastro-esophageal reflux disease without esophagitis: Secondary | ICD-10-CM | POA: Diagnosis not present

## 2021-01-24 DIAGNOSIS — I10 Essential (primary) hypertension: Secondary | ICD-10-CM | POA: Diagnosis not present

## 2021-01-24 DIAGNOSIS — R413 Other amnesia: Secondary | ICD-10-CM | POA: Diagnosis not present

## 2021-01-27 ENCOUNTER — Telehealth: Payer: Self-pay | Admitting: Cardiology

## 2021-01-27 NOTE — Telephone Encounter (Signed)
Received a call from patient's son he stated mother had chest pain for about 30 mins this morning.No chest pain at present.Stated she has appointment with Suezanne Cheshire PA 02/23/21 at 10:15 am.Advised to keep appointment.Advised to go to ED if she has any more chest pain.

## 2021-01-27 NOTE — Telephone Encounter (Signed)
Pt c/o of Chest Pain: STAT if CP now or developed within 24 hours  1. Are you having CP right now? Yes but it is going away  2. Are you experiencing any other symptoms (ex. SOB, nausea, vomiting, sweating)?   3. How long have you been experiencing CP? 1 hour  4. Is your CP continuous or coming and going? Coming and going  5. Have you taken Nitroglycerin? No pt's son is not sure if pt has nitro ?

## 2021-02-05 ENCOUNTER — Other Ambulatory Visit: Payer: Self-pay

## 2021-02-05 ENCOUNTER — Emergency Department
Admission: EM | Admit: 2021-02-05 | Discharge: 2021-02-05 | Disposition: A | Discharge status: Home / Self Care | Payer: Medicare Other | Source: Home / Self Care

## 2021-02-05 ENCOUNTER — Encounter: Payer: Self-pay | Admitting: Emergency Medicine

## 2021-02-05 DIAGNOSIS — R300 Dysuria: Secondary | ICD-10-CM

## 2021-02-05 DIAGNOSIS — R3915 Urgency of urination: Secondary | ICD-10-CM

## 2021-02-05 LAB — POCT URINALYSIS DIP (MANUAL ENTRY)
Bilirubin, UA: NEGATIVE
Blood, UA: NEGATIVE
Glucose, UA: NEGATIVE mg/dL
Ketones, POC UA: NEGATIVE mg/dL
Nitrite, UA: NEGATIVE
Protein Ur, POC: NEGATIVE mg/dL
Spec Grav, UA: 1.025 (ref 1.010–1.025)
Urobilinogen, UA: 0.2 E.U./dL
pH, UA: 5.5 (ref 5.0–8.0)

## 2021-02-05 MED ORDER — PHENAZOPYRIDINE HCL 200 MG PO TABS: 200.0000 mg | ORAL_TABLET | Freq: Three times a day (TID) | ORAL | 0 refills | Status: AC

## 2021-02-05 NOTE — ED Provider Notes (Signed)
[facility]    CSN: [id number] Arrival date & time: 02/05/21  1527      History   Chief Complaint Chief Complaint  Patient presents with  . Possible UTI    HPI Rebecca Cannon is a 87 y.o. female.   HPI 87 year old female presents with dysuria for 2-3 days and is accompanied by her son this evening.  Past Medical History:  Diagnosis Date  . Atrial fibrillation ([place])   . CVA (cerebral vascular accident) ([place])   . Hypertension     There are no problems to display for this patient.   Past Surgical History:  Procedure Laterality Date  . TONSILLECTOMY    . Varicose veins      OB History   No obstetric history on file.      Home Medications    Prior to Admission medications   Medication Sig Start Date End Date Taking? Authorizing Provider  amLODipine (NORVASC) 5 MG tablet Take 1 tablet (5 mg total) by mouth daily. 01/04/21 04/04/21 Yes Lelon Perla, MD  losartan (COZAAR) 100 MG tablet TAKE 1 TABLET BY MOUTH  DAILY 11/02/20  Yes Crenshaw, Denice Bors, MD  Omega-3 Fatty Acids (FISH OIL) 1000 MG CAPS Take by mouth.   Yes [provider]  omeprazole (PRILOSEC) 20 MG capsule Take 20 mg by mouth daily. 05/17/19  Yes [provider]  oxybutynin (DITROPAN) 5 MG tablet  05/17/19  Yes [provider]  phenazopyridine (PYRIDIUM) 200 MG tablet Take 1 tablet (200 mg total) by mouth 3 (three) times daily for 5 days. 02/05/21 02/10/21 Yes Eliezer Lofts, FNP  simvastatin (ZOCOR) 40 MG tablet Take 40 mg by mouth at bedtime. 05/17/19  Yes [provider]  timolol (TIMOPTIC) 0.5 % ophthalmic solution 1 drop 2 (two) times daily. 06/18/19  Yes [provider]  vitamin B-12 (CYANOCOBALAMIN) 100 MCG tablet Take 100 mcg by mouth daily.   Yes [provider]  vitamin E 1000 UNIT capsule Take 1,000 Units by mouth daily.   Yes [provider]  XARELTO 20 MG TABS tablet Take 1 tablet (20 mg total) by mouth daily with supper.  12/14/20  Yes Lelon Perla, MD    Family History Family History  Problem Relation Age of Onset  . Stroke Father     Social History Social History   Tobacco Use  . Smoking status: Never Smoker  . Smokeless tobacco: Never Used  Substance Use Topics  . Alcohol use: Yes    Comment: Rare     Allergies   Iodine   Review of Systems Review of Systems  Constitutional: Negative.   HENT: Negative.   Eyes: Negative.   Respiratory: Negative.   Cardiovascular: Negative.   Gastrointestinal: Negative.   Genitourinary: Positive for dysuria and urgency.  Musculoskeletal: Negative.   Skin: Negative.   Neurological: Negative.      Physical Exam Triage Vital Signs ED Triage Vitals  Enc Vitals Group     BP 02/05/21 1552 107/61     Pulse Rate 02/05/21 1552 60     Resp --      Temp 02/05/21 1552 98.9 F (37.2 C)     Temp Source 02/05/21 1552 Oral     SpO2 02/05/21 1552 94 %     Weight --      Height --      Head Circumference --      Peak Flow --      Pain Score 02/05/21 1554 0  Pain Loc --      Pain Edu? --      Excl. in [place]? --    No data found.  Updated Vital Signs BP 107/61 (BP Location: Right Arm)   Pulse 60   Temp 98.9 F (37.2 C) (Oral)   SpO2 94%   Physical Exam Vitals and nursing note reviewed.  Constitutional:      General: She is not in acute distress.    Appearance: Normal appearance. She is not ill-appearing.  HENT:     Head: Normocephalic and atraumatic.     Mouth/Throat:     Mouth: Mucous membranes are moist.     Pharynx: Oropharynx is clear.  Eyes:     Extraocular Movements: Extraocular movements intact.     Conjunctiva/sclera: Conjunctivae normal.     Pupils: Pupils are equal, round, and reactive to light.  Cardiovascular:     Rate and Rhythm: Normal rate and regular rhythm.     Pulses: Normal pulses.     Heart sounds: Normal heart sounds.  Pulmonary:     Effort: Pulmonary effort is normal.     Breath sounds: Normal breath sounds.      Comments: No adventitious breath sounds Abdominal:     Tenderness: There is no right CVA tenderness or left CVA tenderness.  Musculoskeletal:     Cervical back: Normal range of motion and neck supple.  Skin:    General: Skin is warm and dry.  Neurological:     General: No focal deficit present.     Mental Status: She is alert and oriented to person, place, and time.  Psychiatric:        Mood and Affect: Mood normal.        Behavior: Behavior normal.      UC Treatments / Results  Labs (all labs ordered are listed, but only abnormal results are displayed) Labs Reviewed  POCT URINALYSIS DIP (MANUAL ENTRY) - Abnormal; Notable for the following components:      Result Value   Leukocytes, UA Trace (*)    All other components within normal limits  URINE CULTURE    EKG   Radiology No results found.  Procedures Procedures (including critical care time)  Medications Ordered in UC Medications - No data to display  Initial Impression / Assessment and Plan / UC Course  I have reviewed the triage vital signs and the nursing notes.  Pertinent labs & imaging results that were available during my care of the patient were reviewed by me and considered in my medical decision making (see chart for details).     MDM: 1.  Urinary frequency, 2.  Dysuria.  Patient discharged home, hemodynamically stable Final Clinical Impressions(s) / UC Diagnoses   Final diagnoses:  Urinary urgency  Dysuria     Discharge Instructions     Advised patient/son UA is clear for now we will follow-up with urine culture results once received advised patient may use Pyridium for dysuria for the next 3 to 4 days, as needed.    ED Prescriptions    Medication Sig Dispense Auth. Provider   phenazopyridine (PYRIDIUM) 200 MG tablet Take 1 tablet (200 mg total) by mouth 3 (three) times daily for 5 days. 15 tablet Eliezer Lofts, FNP     PDMP not reviewed this encounter.   Eliezer Lofts,  [place] 02/05/21 1710

## 2021-02-05 NOTE — ED Triage Notes (Signed)
Patient's son states that patient has been treated several times for UTI's.  States that she been "off" and in the past it's usually a UTI.

## 2021-02-05 NOTE — Discharge Instructions (Addendum)
Advised patient/son UA is clear for now we will follow-up with urine culture results once received advised patient may use Pyridium for dysuria for the next 3 to 4 days, as needed.

## 2021-02-07 DIAGNOSIS — R059 Cough, unspecified: Secondary | ICD-10-CM | POA: Diagnosis not present

## 2021-02-07 DIAGNOSIS — R9431 Abnormal electrocardiogram [ECG] [EKG]: Secondary | ICD-10-CM | POA: Diagnosis not present

## 2021-02-07 DIAGNOSIS — I4891 Unspecified atrial fibrillation: Secondary | ICD-10-CM | POA: Diagnosis not present

## 2021-02-07 DIAGNOSIS — Z7901 Long term (current) use of anticoagulants: Secondary | ICD-10-CM | POA: Diagnosis not present

## 2021-02-07 DIAGNOSIS — R0602 Shortness of breath: Secondary | ICD-10-CM | POA: Diagnosis not present

## 2021-02-07 DIAGNOSIS — U071 COVID-19: Secondary | ICD-10-CM | POA: Diagnosis not present

## 2021-02-07 DIAGNOSIS — I700 Atherosclerosis of aorta: Secondary | ICD-10-CM | POA: Diagnosis not present

## 2021-02-07 LAB — URINE CULTURE
MICRO NUMBER:: 11948555
SPECIMEN QUALITY:: ADEQUATE

## 2021-02-20 ENCOUNTER — Telehealth: Payer: Self-pay | Admitting: Cardiology

## 2021-02-20 ENCOUNTER — Other Ambulatory Visit: Payer: Self-pay

## 2021-02-20 ENCOUNTER — Telehealth: Payer: Self-pay | Admitting: Physician Assistant

## 2021-02-20 ENCOUNTER — Encounter: Payer: Self-pay | Admitting: Physician Assistant

## 2021-02-20 ENCOUNTER — Ambulatory Visit: Payer: Medicare Other | Admitting: Physician Assistant

## 2021-02-20 VITALS — BP 120/60 | HR 82 | Ht 65.0 in | Wt 177.6 lb

## 2021-02-20 DIAGNOSIS — N390 Urinary tract infection, site not specified: Secondary | ICD-10-CM

## 2021-02-20 DIAGNOSIS — Z8673 Personal history of transient ischemic attack (TIA), and cerebral infarction without residual deficits: Secondary | ICD-10-CM | POA: Diagnosis not present

## 2021-02-20 DIAGNOSIS — I10 Essential (primary) hypertension: Secondary | ICD-10-CM | POA: Diagnosis not present

## 2021-02-20 DIAGNOSIS — I4821 Permanent atrial fibrillation: Secondary | ICD-10-CM

## 2021-02-20 DIAGNOSIS — G459 Transient cerebral ischemic attack, unspecified: Secondary | ICD-10-CM | POA: Diagnosis not present

## 2021-02-20 NOTE — Telephone Encounter (Signed)
Pt c/o medication issue:  1. Name of Medication: amLODipine (NORVASC) 5 MG tablet  2. How are you currently taking this medication (dosage and times per day)? Pt was instructed to take 10 mg per day  3. Are you having a reaction (difficulty breathing--STAT)? No  4. What is your medication issue? Pt was instructed to take 10 mg of this medicine daily if her BP is in the 140's, pharmacy needs a new script for this medicine

## 2021-02-20 NOTE — Progress Notes (Signed)
Cardiology Office Note:    Date:  02/22/2021   ID:  Yong Channel, DOB 11-06-1928, MRN [id number]  PCP:  Jamie Kato   [facility] Providers Cardiologist:  Kirk Ruths, MD     Referring MD: Ramiro Harvest, PA-C   Chief Complaint  Patient presents with   Follow-up    Seen for Dr. [name]     History of Present Illness:    Rebecca Cannon is a 87 y.o. female with a hx of hypertension, CVA and history of permanent atrial fibrillation.  Patient previously lived in [place] and then moved to [place].  Echocardiogram in December 2020 showed normal EF, mild right ventricular enlargement, biatrial enlargement, mild aortic and mitral regurgitation.  Prior monitoring in January 2021 showed atrial fibrillation with PVCs and aberrantly conducted heartbeat.  Due to bradycardia seen on the heart monitor, her metoprolol was discontinued.  She was last seen by Dr. [name] on 11/29/2020 at which time she was doing well.  She was to continue Xarelto.  Recently patient's son contacted cardiology service complaining of her heart rate being slow and blood pressure elevated.  Timolol eyedrops was discontinued.  She was started on amlodipine 5 mg daily.  I last saw the patient on 01/13/2021, since starting on amlodipine, her blood pressure has been in the 140s.  Her son did have some neurological concerns, I asked her to follow-up with her neurologist.  I did a urinalysis which came back positive for UTI and started her on cefdinir 300 mg twice a day for 10 days.  She was seen in the urgent care on 02/05/2021 for dysuria and was given Pyridium for 5 days.  Unfortunately she went to [facility] ED on 02/07/2021 with shortness of breath and was diagnosed with COVID-19.  Patient presents today for follow-up.  She has multiple concerns, most of which are neurologic.  There has been several episodes where she felt like she was having a stroke, she could not talk, she was very weak and had  trouble swallowing.  So far she has had at least 2 episodes of this symptoms.  I previously instructed them to see neurology service, however they are unable to get in until August.  I recommended go ahead and order MRI of the brain just to make sure there is nothing acute.  She is still short of breath, however her shortness of breath seems to be intermittent and related to anxiety.  On exam, her lungs is clear, she has no acute pulmonary issue seen on recent CT of the chest in April 2022.  Her blood pressure and heart rate is seems to be very well controlled.  She does not have any sign of orthostatic dizziness.  I recommended continue on the current therapy.  She can follow-up with Dr. [name] in 3 to 4 months.  Past Medical History:  Diagnosis Date   Atrial fibrillation ([place])    CVA (cerebral vascular accident) ([place])    Hypertension     Past Surgical History:  Procedure Laterality Date   TONSILLECTOMY     Varicose veins      Current Medications: Current Meds  Medication Sig   losartan (COZAAR) 100 MG tablet TAKE 1 TABLET BY MOUTH  DAILY   Omega-3 Fatty Acids (FISH OIL) 1000 MG CAPS Take by mouth.   omeprazole (PRILOSEC) 20 MG capsule Take 20 mg by mouth daily.   oxybutynin (DITROPAN) 5 MG tablet    simvastatin (ZOCOR) 40 MG tablet Take 40 mg  by mouth at bedtime.   timolol (TIMOPTIC) 0.5 % ophthalmic solution 1 drop 2 (two) times daily.   vitamin B-12 (CYANOCOBALAMIN) 100 MCG tablet Take 100 mcg by mouth daily.   vitamin E 1000 UNIT capsule Take 1,000 Units by mouth daily.   XARELTO 20 MG TABS tablet Take 1 tablet (20 mg total) by mouth daily with supper.   [DISCONTINUED] amLODipine (NORVASC) 5 MG tablet Take 1 tablet (5 mg total) by mouth daily. (Patient taking differently: Take 5 mg by mouth daily. Takes 10 MG daily as of now.)     Allergies:   Iodine   Social History   Socioeconomic History   Marital status: Divorced    Spouse name: Not on file   Number of children: 10    Years of education: Not on file   Highest education level: Not on file  Occupational History   Not on file  Tobacco Use   Smoking status: Never   Smokeless tobacco: Never  Substance and Sexual Activity   Alcohol use: Yes    Comment: Rare   Drug use: Not on file   Sexual activity: Not on file  Other Topics Concern   Not on file  Social History Narrative   Not on file   Social Determinants of Health   Financial Resource Strain: Not on file  Food Insecurity: Not on file  Transportation Needs: Not on file  Physical Activity: Not on file  Stress: Not on file  Social Connections: Not on file     Family History: The patient's family history includes Stroke in her father.  ROS:   Please see the history of present illness.     All other systems reviewed and are negative.  EKGs/Labs/Other Studies Reviewed:    The following studies were reviewed today:  Echo 08/27/2019  1. Left ventricular ejection fraction, by visual estimation, is 55 to  60%. The left ventricle has normal function. Left ventricular septal wall  thickness was mildly increased. Normal left ventricular posterior wall  thickness. There is no left  ventricular hypertrophy.   2. The left ventricle has no regional wall motion abnormalities.   3. Global right ventricle has normal systolic function.The right  ventricular size is mildly enlarged. No increase in right ventricular wall  thickness.   4. Left atrial size was moderately dilated.   5. Right atrial size was mildly dilated.   6. Mild mitral annular calcification.   7. The mitral valve is degenerative. Mild mitral valve regurgitation. No  evidence of mitral stenosis.   8. The tricuspid valve is normal in structure. Tricuspid valve  regurgitation is mild.   9. The aortic valve is tricuspid. Aortic valve regurgitation is mild.  Mild aortic valve sclerosis without stenosis.  10. The pulmonic valve was normal in structure. Pulmonic valve  regurgitation is  not visualized.  11. There is mild dilatation of the ascending aorta measuring 37 mm.  12. Normal pulmonary artery systolic pressure.  13. The inferior vena cava is normal in size with greater than 50%  respiratory variability, suggesting right atrial pressure of 3 mmHg.   EKG:  EKG is not ordered today.    Recent Labs: No results found for requested labs within last 8760 hours.  Recent Lipid Panel    Component Value Date/Time   CHOL 151 08/24/2019 1256   TRIG 143 08/24/2019 1256   HDL 61 08/24/2019 1256   CHOLHDL 2.5 08/24/2019 1256   LDLCALC 66 08/24/2019 1256  Risk Assessment/Calculations:       Physical Exam:    VS:  BP 120/60   Pulse 82   Ht 5\' 5"  (1.651 m)   Wt 177 lb 9.6 oz (80.6 kg)   SpO2 97%   BMI 29.55 kg/m     Wt Readings from Last 3 Encounters:  02/20/21 177 lb 9.6 oz (80.6 kg)  01/13/21 177 lb 9.6 oz (80.6 kg)  11/29/20 181 lb (82.1 kg)     GEN:  Well nourished, well developed in no acute distress HEENT: Normal NECK: No JVD; No carotid bruits LYMPHATICS: No lymphadenopathy CARDIAC: Irregularly irregular, no murmurs, rubs, gallops RESPIRATORY:  Clear to auscultation without rales, wheezing or rhonchi  ABDOMEN: Soft, non-tender, non-distended MUSCULOSKELETAL:  No edema; No deformity  SKIN: Warm and dry NEUROLOGIC:  Alert and oriented x 3 PSYCHIATRIC:  Normal affect   ASSESSMENT:    1. TIA (transient ischemic attack)   2. Permanent atrial fibrillation ([place])   3. Essential hypertension   4. H/O: CVA (cerebrovascular accident)   5. Urinary tract infection without hematuria, site unspecified    PLAN:    In order of problems listed above:  Possible TIA: So far patient has had 2 episodes where she was unable to speak, she had a sudden onset of weakness and trouble swallowing as well.  I previously recommended she discuss this with her neurologist, however she is unable to see her neurologist for another 91-month.  I recommend a MRI of the brain  to make sure she has not had any new neurological issue.  She has been compliant with Xarelto.  Permanent atrial fibrillation: Continue Xarelto.  Hypertension: Blood pressure stable  History of CVA: Unclear if recent symptoms represent TIA.  UTI: Finished a course of antibiotic.  Repeat urinalysis shows her UTI has cleared.        Medication Adjustments/Labs and Tests Ordered: Current medicines are reviewed at length with the patient today.  Concerns regarding medicines are outlined above.  Orders Placed This Encounter  Procedures   MR BRAIN W WO CONTRAST   No orders of the defined types were placed in this encounter.   Patient Instructions  Medication Instructions:  Your physician recommends that you continue on your current medications as directed. Please refer to the Current Medication list given to you today.  *If you need a refill on your cardiac medications before your next appointment, please call your pharmacy*  Lab Work: NONE ordered at this time of appointment   If you have labs (blood work) drawn today and your tests are completely normal, you will receive your results only by: [place] (if you have MyChart) OR A paper copy in the mail If you have any lab test that is abnormal or we need to change your treatment, we will call you to review the results.  Testing/Procedures: Your physician has ordered for you to have a MRI of the brain  Magnetic Resonance Imaging Magnetic resonance imaging (MRI) is a painless test that takes pictures of the inside of your body. This test uses a strong magnet. It does not use X-rays. AnMRI can show more details about a medical problem than other tests. Tell a health care provider about: Any allergies you have. All medicines you are taking. This includes vitamins, herbs, eye drops, creams, and over-the-counter medicines. Any surgeries you have had. Any medical problems you have. Any metal you may have in your body. This  includes: Any new joint, such as a man-made (artificial)  knee or hip. Any implanted devices, such as a pacemaker. An ear implant with metal (cochlear implant). An artificial heart valve. An object in your eye that has metal. Metal splinters. Pieces of a bullet. A port for insulin or chemotherapy. Any tattoos you have. If you have a birth control implant, such as an IUD. Whether you are pregnant, may be pregnant, or are breastfeeding. Any fear of small spaces (claustrophobia). You may be given a medicine to help you relax. What are the risks? Generally, this is a safe test. However, problems may occur. These include: If you have metal in your body near the area being tested, it may be hard to get clear pictures. If you are pregnant, you should avoid MRI tests during the first three months of pregnancy. If you are breastfeeding and dye will be used during your test, you may need to stop until the dye leaves your body. If dye is used, there is a risk of an allergic reaction to the dye. You can take medicines to prevent this reaction or to treat it if you have symptoms. If dye is used, it can cause damage to your kidneys. Drinking plenty of water before and after the test can help prevent this. What happens before the test? You will be asked to take off all metal. This includes: Your watch, jewelry, and other metal things. Hearing aids. Dentures. An underwire bra. Makeup. Braces and dental fillings normally are not a problem. If you are breastfeeding, ask your doctor if you need to pump before your test. You may need to stop breastfeeding for a time if dye will be used. What happens during the test?  You may be given earplugs or headphones to listen to music. The MRI machine can be noisy. You will lie down on a table. If dye will be used, an IV tube will be placed into one of your veins. Dye will be given through your IV tube. The table will slide into a tunnel. The tunnel has magnets  inside of it. When you are inside the tunnel, you will still be able to talk to your doctor. The tunnel will scan your body and make images. You will be asked to lie very still. Your doctor will tell you when you can move. When all images are taken, the table will slide out of the tunnel. The test can take 30 minutes to over an hour. The test may vary among doctors and hospitals. What can I expect after test? If you were given a medicine to help you relax, you may be monitored until you leave the hospital or clinic. This includes checking your blood pressure, heart rate, breathing rate, and blood oxygen level. If dye was used: It will leave your body through your pee (urine). This takes about a day. You may be told to drink plenty of fluids. This helps your body get rid of the dye. Do not breastfeed your child until your doctor says that this is safe. Follow these instructions at home: You may go back to your normal activities right away, or as told by your doctor. It is up to you to get your test results. Ask how to get your results when they are ready. Keep all follow-up visits. Summary Magnetic resonance imaging (MRI) is a painless test that takes pictures of the inside of your body. Dye may be used to get MRI pictures that are even more clear. Before your MRI, be sure to tell your doctor about any metal  you may have in your body. Talk with your doctor about what your test results mean. This information is not intended to replace advice given to you by your health care provider. Make sure you discuss any questions you have with your healthcare provider. Document Revised: 12/30/2019 Document Reviewed: 12/30/2019 Elsevier Patient Education  2022 [place]: At [facility], you and your health needs are our priority.  As part of our continuing mission to provide you with exceptional heart care, we have created designated Provider Care Teams.  These Care Teams include your  primary Cardiologist (physician) and Advanced Practice Providers (APPs -  Physician Assistants and Nurse Practitioners) who all work together to provide you with the care you need, when you need it.  Your next appointment:   3-4 month(s)  The format for your next appointment:   In Person  Provider:   Kirk Ruths, MD  Other Instructions   Signed, Almyra Deforest, [place]  02/22/2021 11:09 PM    [place]

## 2021-02-20 NOTE — Telephone Encounter (Signed)
Spoke with Rebecca Cannon ( on DPR) regarding the Saturday 03/04/21 11:00 am---MRI brain appointment at [place] , [place], [place]---arrival time is 10:30 am at the Radiology Department for check in---Rebecca Cannon voiced his understanding.Marland Kitchen

## 2021-02-20 NOTE — Patient Instructions (Signed)
Medication Instructions:  Your physician recommends that you continue on your current medications as directed. Please refer to the Current Medication list given to you today.  *If you need a refill on your cardiac medications before your next appointment, please call your pharmacy*  Lab Work: NONE ordered at this time of appointment   If you have labs (blood work) drawn today and your tests are completely normal, you will receive your results only by: [place] (if you have MyChart) OR A paper copy in the mail If you have any lab test that is abnormal or we need to change your treatment, we will call you to review the results.  Testing/Procedures: Your physician has ordered for you to have a MRI of the brain  Magnetic Resonance Imaging Magnetic resonance imaging (MRI) is a painless test that takes pictures of the inside of your body. This test uses a strong magnet. It does not use X-rays. AnMRI can show more details about a medical problem than other tests. Tell a health care provider about: Any allergies you have. All medicines you are taking. This includes vitamins, herbs, eye drops, creams, and over-the-counter medicines. Any surgeries you have had. Any medical problems you have. Any metal you may have in your body. This includes: Any new joint, such as a man-made (artificial) knee or hip. Any implanted devices, such as a pacemaker. An ear implant with metal (cochlear implant). An artificial heart valve. An object in your eye that has metal. Metal splinters. Pieces of a bullet. A port for insulin or chemotherapy. Any tattoos you have. If you have a birth control implant, such as an IUD. Whether you are pregnant, may be pregnant, or are breastfeeding. Any fear of small spaces (claustrophobia). You may be given a medicine to help you relax. What are the risks? Generally, this is a safe test. However, problems may occur. These include: If you have metal in your body near  the area being tested, it may be hard to get clear pictures. If you are pregnant, you should avoid MRI tests during the first three months of pregnancy. If you are breastfeeding and dye will be used during your test, you may need to stop until the dye leaves your body. If dye is used, there is a risk of an allergic reaction to the dye. You can take medicines to prevent this reaction or to treat it if you have symptoms. If dye is used, it can cause damage to your kidneys. Drinking plenty of water before and after the test can help prevent this. What happens before the test? You will be asked to take off all metal. This includes: Your watch, jewelry, and other metal things. Hearing aids. Dentures. An underwire bra. Makeup. Braces and dental fillings normally are not a problem. If you are breastfeeding, ask your doctor if you need to pump before your test. You may need to stop breastfeeding for a time if dye will be used. What happens during the test?  You may be given earplugs or headphones to listen to music. The MRI machine can be noisy. You will lie down on a table. If dye will be used, an IV tube will be placed into one of your veins. Dye will be given through your IV tube. The table will slide into a tunnel. The tunnel has magnets inside of it. When you are inside the tunnel, you will still be able to talk to your doctor. The tunnel will scan your body and make images.  You will be asked to lie very still. Your doctor will tell you when you can move. When all images are taken, the table will slide out of the tunnel. The test can take 30 minutes to over an hour. The test may vary among doctors and hospitals. What can I expect after test? If you were given a medicine to help you relax, you may be monitored until you leave the hospital or clinic. This includes checking your blood pressure, heart rate, breathing rate, and blood oxygen level. If dye was used: It will leave your body through  your pee (urine). This takes about a day. You may be told to drink plenty of fluids. This helps your body get rid of the dye. Do not breastfeed your child until your doctor says that this is safe. Follow these instructions at home: You may go back to your normal activities right away, or as told by your doctor. It is up to you to get your test results. Ask how to get your results when they are ready. Keep all follow-up visits. Summary Magnetic resonance imaging (MRI) is a painless test that takes pictures of the inside of your body. Dye may be used to get MRI pictures that are even more clear. Before your MRI, be sure to tell your doctor about any metal you may have in your body. Talk with your doctor about what your test results mean. This information is not intended to replace advice given to you by your health care provider. Make sure you discuss any questions you have with your healthcare provider. Document Revised: 12/30/2019 Document Reviewed: 12/30/2019 Elsevier Patient Education  2022 [place]: At [facility], you and your health needs are our priority.  As part of our continuing mission to provide you with exceptional heart care, we have created designated Provider Care Teams.  These Care Teams include your primary Cardiologist (physician) and Advanced Practice Providers (APPs -  Physician Assistants and Nurse Practitioners) who all work together to provide you with the care you need, when you need it.  Your next appointment:   3-4 month(s)  The format for your next appointment:   In Person  Provider:   Kirk Ruths, MD  Other Instructions

## 2021-02-21 ENCOUNTER — Telehealth: Payer: Self-pay | Admitting: Physician Assistant

## 2021-02-21 MED ORDER — AMLODIPINE BESYLATE 5 MG PO TABS: 5.0000 mg | ORAL_TABLET | Freq: Every day | ORAL | 3 refills | Status: DC

## 2021-02-21 MED ORDER — AMLODIPINE BESYLATE 5 MG PO TABS: 10.0000 mg | ORAL_TABLET | Freq: Every day | ORAL | 3 refills | Status: DC

## 2021-02-21 NOTE — Telephone Encounter (Signed)
Called patient's son Shaylene Paganelli and left a detailed voice message stating that a new Rx of Amlodipine was sent to [facility] for Amlodipine 5 mg with directions to take 2 tablets (10 mg) by mouth daily. If he has any questions to please give our office a call.

## 2021-02-21 NOTE — Telephone Encounter (Signed)
New Message:      Son called and said prescription for Amlodipine was incorrect. It had been increased to 10 mg per day and it was only called in for 5 mg.

## 2021-02-21 NOTE — Telephone Encounter (Signed)
Message routed to Homer PA to review and advise as patient was seen 02/20/21  Phone note refill request from 02/20/21: Pt c/o medication issue:   1. Name of Medication: amLODipine (NORVASC) 5 MG tablet   2. How are you currently taking this medication (dosage and times per day)? Pt was instructed to take 10 mg per day   3. Are you having a reaction (difficulty breathing--STAT)? No   4. What is your medication issue? Pt was instructed to take 10 mg of this medicine daily if her BP is in the 140's, pharmacy needs a new script for this medicine  Med was refilled on 02/20/21 as 5mg , which is on chart.

## 2021-02-21 NOTE — Telephone Encounter (Signed)
Per Almyra Deforest, PA-C office note from 01/13/21 "Blood pressure continues to be elevated, he was started on amlodipine 5 mg a week ago, continue on the current therapy for 1 more week.  If systolic blood pressure is still greater than 140 mmHg, will further increase amlodipine to 10 mg daily". Patient stated at office visit for 02/20/21 that she is currently taking Amlodipine 2 tablets daily. Spoke with Almyra Deforest, PA-C and he is ok with patient having a new Rx for the Amlodipine for 10 mg daily. I will send new Rx to patient's pharmacy on file and call and inform patient.

## 2021-02-22 ENCOUNTER — Encounter: Payer: Self-pay | Admitting: Physician Assistant

## 2021-03-04 ENCOUNTER — Ambulatory Visit (HOSPITAL_BASED_OUTPATIENT_CLINIC_OR_DEPARTMENT_OTHER): Payer: Medicare Other

## 2021-03-04 ENCOUNTER — Other Ambulatory Visit: Payer: Self-pay

## 2021-03-04 ENCOUNTER — Ambulatory Visit (HOSPITAL_BASED_OUTPATIENT_CLINIC_OR_DEPARTMENT_OTHER)
Admission: RE | Admit: 2021-03-04 | Discharge: 2021-03-04 | Disposition: A | Discharge status: Home / Self Care | Payer: Medicare Other | Source: Ambulatory Visit | Attending: Physician Assistant | Admitting: Physician Assistant

## 2021-03-04 DIAGNOSIS — I639 Cerebral infarction, unspecified: Secondary | ICD-10-CM | POA: Diagnosis not present

## 2021-03-04 DIAGNOSIS — R531 Weakness: Secondary | ICD-10-CM | POA: Diagnosis not present

## 2021-03-04 DIAGNOSIS — R29898 Other symptoms and signs involving the musculoskeletal system: Secondary | ICD-10-CM | POA: Diagnosis not present

## 2021-03-04 DIAGNOSIS — G459 Transient cerebral ischemic attack, unspecified: Secondary | ICD-10-CM | POA: Insufficient documentation

## 2021-03-04 DIAGNOSIS — G319 Degenerative disease of nervous system, unspecified: Secondary | ICD-10-CM | POA: Diagnosis not present

## 2021-03-04 DIAGNOSIS — R410 Disorientation, unspecified: Secondary | ICD-10-CM | POA: Diagnosis not present

## 2021-03-04 MED ORDER — GADOBUTROL 1 MMOL/ML IV SOLN
8.0000 mL | Freq: Once | INTRAVENOUS | Status: AC | PRN
  Administered 2021-03-04: 8 mL via INTRAVENOUS

## 2021-03-24 ENCOUNTER — Telehealth: Payer: Self-pay

## 2021-03-24 NOTE — Telephone Encounter (Addendum)
Left voice message for patient to give office a call for results.  ----- Message from [place], [place] sent at 03/24/2021 11:23 AM EDT ----- No sign of acute stroke based on this MRI, however she has signs of old stroke and small vessel disease as well. Keep upcoming neurology appt.

## 2021-04-19 ENCOUNTER — Ambulatory Visit: Payer: Medicare Other | Admitting: Neurology

## 2021-04-24 DIAGNOSIS — H353131 Nonexudative age-related macular degeneration, bilateral, early dry stage: Secondary | ICD-10-CM | POA: Diagnosis not present

## 2021-04-24 DIAGNOSIS — H527 Unspecified disorder of refraction: Secondary | ICD-10-CM | POA: Diagnosis not present

## 2021-04-24 DIAGNOSIS — H43813 Vitreous degeneration, bilateral: Secondary | ICD-10-CM | POA: Diagnosis not present

## 2021-04-24 DIAGNOSIS — H11002 Unspecified pterygium of left eye: Secondary | ICD-10-CM | POA: Diagnosis not present

## 2021-04-24 DIAGNOSIS — H35033 Hypertensive retinopathy, bilateral: Secondary | ICD-10-CM | POA: Diagnosis not present

## 2021-04-24 DIAGNOSIS — H401132 Primary open-angle glaucoma, bilateral, moderate stage: Secondary | ICD-10-CM | POA: Diagnosis not present

## 2021-04-24 DIAGNOSIS — Z961 Presence of intraocular lens: Secondary | ICD-10-CM | POA: Diagnosis not present

## 2021-05-11 ENCOUNTER — Encounter: Payer: Self-pay | Admitting: *Deleted

## 2021-05-11 ENCOUNTER — Other Ambulatory Visit: Payer: Self-pay | Admitting: *Deleted

## 2021-05-11 ENCOUNTER — Other Ambulatory Visit: Payer: Self-pay | Admitting: Cardiology

## 2021-05-11 DIAGNOSIS — I4821 Permanent atrial fibrillation: Secondary | ICD-10-CM

## 2021-05-16 ENCOUNTER — Ambulatory Visit: Payer: Medicare Other | Admitting: Neurology

## 2021-05-16 ENCOUNTER — Encounter: Payer: Self-pay | Admitting: Neurology

## 2021-05-16 VITALS — BP 134/72 | HR 59 | Ht 65.0 in | Wt 178.6 lb

## 2021-05-16 DIAGNOSIS — R6889 Other general symptoms and signs: Secondary | ICD-10-CM | POA: Diagnosis not present

## 2021-05-16 DIAGNOSIS — Z79899 Other long term (current) drug therapy: Secondary | ICD-10-CM | POA: Diagnosis not present

## 2021-05-16 DIAGNOSIS — Z131 Encounter for screening for diabetes mellitus: Secondary | ICD-10-CM | POA: Diagnosis not present

## 2021-05-16 DIAGNOSIS — I482 Chronic atrial fibrillation, unspecified: Secondary | ICD-10-CM

## 2021-05-16 DIAGNOSIS — Z8673 Personal history of transient ischemic attack (TIA), and cerebral infarction without residual deficits: Secondary | ICD-10-CM | POA: Diagnosis not present

## 2021-05-16 DIAGNOSIS — R001 Bradycardia, unspecified: Secondary | ICD-10-CM

## 2021-05-16 DIAGNOSIS — R413 Other amnesia: Secondary | ICD-10-CM

## 2021-05-16 NOTE — Patient Instructions (Signed)
You have a history of memory loss: memory loss or changes in cognitive function can have many reasons and does not always mean you have dementia. Conditions that can contribute to subjective or objective memory loss include: depression, stress, poor sleep from insomnia or sleep apnea, dehydration, fluctuation in blood sugar values, thyroid or electrolyte dysfunction and certain vitamin deficiencies. Dementia can be caused by stroke, brain atherosclerosis or brain vascular disease due to vascular risk factors (smoking, high blood pressure, high cholesterol, obesity and uncontrolled diabetes), certain degenerative brain disorders (including Parkinson's disease and Multiple sclerosis) and by Alzheimer's disease or other, more rare and sometimes hereditary causes. We will do some additional testing: blood work (which we will do today).   You have had a recent brain MRI.   You have risk factors for what we call vascular dementia.   We can consider low-dose of a medication called Namenda (generic name: Memantine), starting at 5 mg once daily to make sure you tolerate the medication.  Side effects may include, but are not limited to: nausea, confusion, hallucination, personality changes. If you are having mild side effects, try to stick with the treatment as these initial side effects may go away after the first 10-14 days.     I would like to avoid a medication called Aricept (generic name: donepezil) since it can cause low heart rate and your heart rate is already on the lower side.    Please call us back if you decide to proceed with the medication.  We can also pick up our discussion during our next visit, please schedule a follow-up appointment with the nurse practitioner in about 3 months, sooner if needed.

## 2021-05-16 NOTE — Progress Notes (Signed)
Subjective:    Patient ID: Rebecca Cannon is a 87 y.o. female.  HPI    History:   Dear Rebecca Cannon,   I saw your patient, Rebecca Cannon, upon your kind request in my neurologic clinic today for initial consultation of her memory loss.  The patient is accompanied by her son, Rebecca Cannon, today.  As you know, Rebecca Cannon is an 87 year old right-handed woman with an underlying complex medical history of stroke, hypertension, hyperlipidemia, macular degeneration, atrial fibrillation, atherosclerosis of the aorta, reflux disease, degenerative joint disease, osteoporosis and overweight state, who reports not having any memory issues.  She reports that her son has issues with his memory.  She is unable to provide a consistent history.  Details of her history are provided by her son, Rebecca Cannon.  She is able to tell me that she has no family history of dementia, mom lived to be 23 years old, father died at 82 from a presumed heart attack.  1 sister passed away and 1 brother passed away, neither had memory issues, she has a younger brother age 66 who has cancer.  Patient used to live in [place] and moved to [place] about 5 years ago.  She has had progressive memory issues for at least several months.  Her son reports that they are looking into moving her into an assisted living facility, she does have long-term care insurance and currently stays in an apartment but they are not renewing her lease as I understand.  She lives alone.  She walks with a walker.  She has a total of 10 children.  She had blood work through your office on 01/03/2021, we will request blood test results from your office.  She has 5 sons and 5 daughters.  She was recently started on a trial of generic Seroquel through your office as I understand.  Per son, she was given a prescription for 25 mg daily but it did not help. I reviewed your office note from 01/24/2021.  She scored 16 out of 30 on the [place] at the time.  She had a recent brain MRI  with and without contrast for concern for TIA on 03/04/2021, I reviewed the results: IMPRESSION: No evidence of acute intracranial abnormality.   Moderate-sized chronic cortical/subcortical infarct within the left frontoparietal lobes and posterior left insula (posterior MCA territory and MCA/PCA watershed territory).   Additional small chronic cortical/subcortical infarcts within the medial left temporal lobe and bilateral occipital lobes (PCA vascular territories).   Chronic small-vessel infarct within the left corona radiata.   Background moderate chronic small vessel ischemic disease within the cerebral white matter, and to a lesser degree within the pons.   Multiple small chronic infarcts within the bilateral cerebellar hemispheres.   Numerous chronic microhemorrhages within the supratentorial brain greater than cerebellum, raising suspicion for cerebral amyloid angiopathy (possibly with superimposed chronic hypertensive microangiopathy).   Mild-to-moderate generalized cerebral atrophy. Comparatively mild cerebellar atrophy.  She reports sleeping well.  She has never had a sleep study, son is not sure if she snores.  She is a non-smoker and drinks alcohol occasionally in the form of a glass of wine.  She drinks caffeine in the form of coffee, 1 or 2 cups daily, no daily soda or tea.  She is trying to hydrate better with water.  Her Past Medical History Is Significant For: Past Medical History:  Diagnosis Date   Aphasia    Atherosclerosis of aorta ([place])    Atrial fibrillation ([place])  CVA (cerebral vascular accident) ([place])    CVD (cardiovascular disease)    DJD (degenerative joint disease)    GERD (gastroesophageal reflux disease)    Hyperlipidemia    Hypertension    Incontinence    Macular degeneration    Memory change    Osteoporosis    Stroke ([place]) 02/2014    Her Past Surgical History Is Significant For: Past Surgical History:  Procedure Laterality Date    TONSILLECTOMY     Varicose veins      Her Family History Is Significant For: Family History  Problem Relation Age of Onset   Stroke Father    Memory loss Neg Hx     Her Social History Is Significant For: Social History   Socioeconomic History   Marital status: Divorced    Spouse name: Not on file   Number of children: 71   Years of education: Not on file   Highest education level: Not on file  Occupational History   Not on file  Tobacco Use   Smoking status: Never   Smokeless tobacco: Never  Vaping Use   Vaping Use: Never used  Substance and Sexual Activity   Alcohol use: Yes    Comment: Rare   Drug use: Never   Sexual activity: Not on file  Other Topics Concern   Not on file  Social History Narrative   Coffee 2 daily   Social Determinants of Health   Financial Resource Strain: Not on file  Food Insecurity: Not on file  Transportation Needs: Not on file  Physical Activity: Not on file  Stress: Not on file  Social Connections: Not on file    Her Allergies Are:  Allergies  Allergen Reactions   Iodine Hives   Amlodipine     Other reaction(s): Other (See Comments)   Cefprozil     Other reaction(s): Other (See Comments)   Mirabegron     Other reaction(s): Other (See Comments)   Peppermint Flavor Other (See Comments)  :   Her Current Medications Are:  Outpatient Encounter Medications as of 05/16/2021  Medication Sig   amLODipine (NORVASC) 5 MG tablet Take 2 tablets (10 mg total) by mouth daily.   dorzolamide (TRUSOPT) 2 % ophthalmic solution 1 drop 3 (three) times daily.   folic acid (FOLVITE) A999333 MCG tablet 1 tablet   losartan (COZAAR) 100 MG tablet TAKE 1 TABLET BY MOUTH  DAILY   Omega-3 Fatty Acids (FISH OIL) 1000 MG CAPS Take by mouth.   omeprazole (PRILOSEC) 20 MG capsule Take 20 mg by mouth daily.   oxybutynin (DITROPAN) 5 MG tablet    QUEtiapine (SEROQUEL) 25 MG tablet Take 25-50 mg by mouth at bedtime.   simvastatin (ZOCOR) 40 MG tablet Take 40  mg by mouth at bedtime.   timolol (TIMOPTIC) 0.5 % ophthalmic solution 1 drop 2 (two) times daily.   vitamin B-12 (CYANOCOBALAMIN) 100 MCG tablet Take 100 mcg by mouth daily.   vitamin E 1000 UNIT capsule Take 1,000 Units by mouth daily.   XARELTO 20 MG TABS tablet Take 1 tablet (20 mg total) by mouth daily with supper.   No facility-administered encounter medications on file as of 05/16/2021.  :  Review of Systems:  Out of a complete 14 point review of systems, all are reviewed and negative with the exception of these symptoms as listed below:  Review of Systems  Neurological:        Pt is here for memory loss. Pt states she has no  memory loss. Pt  son say yes memory loss, more angry , and confusion. Pt is very upset because she is here    Objective:  Neurological Exam  Physical Exam Physical Examination:   Vitals:   05/16/21 1258  BP: 134/72  Pulse: (!) 59    General Examination: The patient is a very pleasant 87 y.o. female in no acute distress. She appears well-developed and well-nourished and well groomed.   HEENT: Normocephalic, atraumatic, pupils are equal, round and reactive to light, extraocular tracking is fairly well-preserved, hearing is grossly intact, has bilateral hearing aids.  Face is symmetric with normal facial animation.  Speech is clear without dysarthria, hypophonia or voice tremor.  Neck is supple, no carotid bruits.  Airway examination reveals mild mouth dryness.  Tongue protrudes centrally and palate elevates symmetrically.  Chest: Clear to auscultation without wheezing, rhonchi or crackles noted.  Heart: S1+S2+0, mildly irregular, mildly bradycardic.   Abdomen: Soft, non-tender and non-distended with normal bowel sounds appreciated on auscultation.  Extremities: There is 1+ pitting edema in the distal lower extremities bilaterally.  She has knee-high compression socks.  Skin: Warm and dry without trophic changes noted.   Musculoskeletal: exam reveals  arthritic changes in both hands.    Neurologically:  Mental status: The patient is awake, alert and oriented to self, year, date.  Her immediate and remote memory are impaired.  She insists that her memory is good.  Mood is mildly irritable.  She is cooperative with the exam. MMSE - Mini Mental State Exam 05/16/2021  Orientation to time 2  Orientation to Place 1  Registration 3  Attention/ Calculation 1  Recall 2  Language- name 2 objects 2  Language- repeat 0  Language- follow 3 step command 3  Language- read & follow direction 1  Write a sentence 1  Copy design 1  Total score 17    On 05/16/2021: CDT: 2/4, AFT: 5/min.  Cranial nerves II - XII are as described above under HEENT exam. In addition: shoulder shrug is normal with equal shoulder height noted. Motor exam: Normal bulk, strength and tone is noted for age, no resting tremor.  Reflexes are trace in the upper extremities, diminished in the lower extremities.  Fine motor skills are grossly intact for age.  Cerebellar testing: No dysmetria or intention tremor on finger to nose testing. There is no truncal or gait ataxia.  Sensory exam: intact to light touch in the upper and lower extremities.  Gait, station and balance: She stands without difficulty, posture is age-appropriate, she uses a 4 wheeled walker, maneuvers it well.   Assessment and Plan:   In summary, Rebecca Cannon is a very pleasant 87 y.o.-year old female with an underlying complex medical history of stroke, hypertension, hyperlipidemia, macular degeneration, atrial fibrillation, atherosclerosis of the aorta, reflux disease, degenerative joint disease, osteoporosis and overweight state, who presents for evaluation of her memory loss of several months duration.  She has had associated mood irritability and has been on Seroquel for the past month without obvious improvement noted per son.  Her memory scores are in the moderately abnormal range, she does have multiple  vascular risk factors including history of stroke, hypertension, hyperlipidemia, atrial fibrillation, atherosclerotic disease of the aorta.  She had a recent brain MRI in June 2022 which indicated chronic vascular changes and multiple prior strokes.  I talked to the patient and her son at length.  We talked about the importance of healthy lifestyle, we talked about potentially utilizing a  memory medication, I would not favored Aricept in her case due to her bradycardia since Aricept can cause bradycardia.  I am suggested we start her on low-dose Namenda generic 5 mg once daily with the intent to increase it gradually.  She is not agreeable to starting medication today.  She is agreeable to pursuing blood work today which I have ordered.  We will check her thyroid function, vitamin B12, vitamin D, A1c.  We will call her with the test results.  They are encouraged to call our office if she changes her mind regarding trial of medication.  She is encouraged to make a follow-up appointment to see one of our nurse practitioners in 3 months, sooner if needed.  Her son is in the process of visiting assisted living facilities and transitioning her to assisted living from currently living independently in an apartment.  She is advised to keep close follow-up with your office.  She is also followed closely by cardiology.  I answered all their questions today and the patient and her son were in agreement.  Thank you very much for allowing me to participate in the care of this nice patient. If I can be of any further assistance to you please do not hesitate to call me at [phone].  Sincerely,   Star Age, MD, PhD

## 2021-05-17 ENCOUNTER — Telehealth: Payer: Self-pay | Admitting: *Deleted

## 2021-05-17 LAB — B12 AND FOLATE PANEL
Folate: >20 ng/mL (ref >3.0)
Vitamin B-12: 1293 pg/mL — ABNORMAL HIGH (ref 232–1245)

## 2021-05-17 LAB — HGB A1C W/O EAG: Hgb A1c MFr Bld: 6 % — ABNORMAL HIGH (ref 4.8–5.6)

## 2021-05-17 LAB — AMMONIA: Ammonia: 52 ug/dL (ref 28–135)

## 2021-05-17 LAB — VITAMIN D 25 HYDROXY (VIT D DEFICIENCY, FRACTURES): Vit D, 25-Hydroxy: 35.2 ng/mL (ref 30.0–100.0)

## 2021-05-17 LAB — TSH: TSH: 2.08 u[IU]/mL (ref 0.450–4.500)

## 2021-05-17 LAB — RPR: RPR Ser Ql: NONREACTIVE

## 2021-05-17 NOTE — Telephone Encounter (Signed)
Spoke with patient's son, [name] (on [place]), and discussed lab results as noted by Dr. Rexene Alberts, which indicate patient has prediabetes. Ammonia level came back in normal range.  He verbalized understanding.  His questions were answered.  He would like to send some questions through my chart to Dr. Rexene Alberts.  He will follow-up with patient's primary care, Ammie Dalton PA.   Results routed to [facility].

## 2021-05-17 NOTE — Telephone Encounter (Signed)
-----   Message from Star Age, MD sent at 05/17/2021 12:14 PM EDT ----- Please advise patient's son, [name], that her labs were benign with the exception of hemoglobin A1c in the prediabetes range.  Given her risk factors for stroke and history of multiple strokes in the past, I would recommend that she follow-up with her primary care regarding risk factor management including prediabetes management and reduction of her risk of developing actual diabetes in the near future.  She has 1 test pending which is the ammonia level which is a marker for liver function.  We will update if this is abnormal.

## 2021-05-26 DIAGNOSIS — R600 Localized edema: Secondary | ICD-10-CM | POA: Diagnosis not present

## 2021-05-26 DIAGNOSIS — R053 Chronic cough: Secondary | ICD-10-CM | POA: Diagnosis not present

## 2021-05-26 DIAGNOSIS — I4891 Unspecified atrial fibrillation: Secondary | ICD-10-CM | POA: Diagnosis not present

## 2021-05-26 DIAGNOSIS — Z23 Encounter for immunization: Secondary | ICD-10-CM | POA: Diagnosis not present

## 2021-05-26 DIAGNOSIS — I700 Atherosclerosis of aorta: Secondary | ICD-10-CM | POA: Diagnosis not present

## 2021-05-31 ENCOUNTER — Ambulatory Visit: Payer: Medicare Other | Admitting: Cardiology

## 2021-06-07 NOTE — Progress Notes (Signed)
HPI: Follow-up atrial fibrillation and dyspnea. Patient previously lived in [place] and then [place]. I have no records available. She apparently has a history of atrial fibrillation. Echocardiogram December 2020 showed normal LV function, mild right ventricular enlargement, biatrial enlargement, mild aortic and mitral regurgitation. Monitor January 2021 showed atrial fibrillation with PVCs or aberrantly conducted beats rate mildly decreased.  Patient was seen by Almyra Deforest with complaints of question TIA recently.  MRI showed old CVA and he recommended follow-up with neurology.  Since last seen she denies dyspnea, chest pain, palpitations or syncope.  No bleeding.  She does prescribe a burning pain in her left foot that has been present for several weeks.  Current Outpatient Medications  Medication Sig Dispense Refill   amLODipine (NORVASC) 5 MG tablet Take 2 tablets (10 mg total) by mouth daily. 180 tablet 3   dorzolamide (TRUSOPT) 2 % ophthalmic solution 1 drop 3 (three) times daily.     folic acid (FOLVITE) 517 MCG tablet 1 tablet     losartan (COZAAR) 100 MG tablet TAKE 1 TABLET BY MOUTH  DAILY 90 tablet 3   Omega-3 Fatty Acids (FISH OIL) 1000 MG CAPS Take by mouth.     omeprazole (PRILOSEC) 20 MG capsule Take 20 mg by mouth daily.     oxybutynin (DITROPAN) 5 MG tablet      QUEtiapine (SEROQUEL) 25 MG tablet Take 25-50 mg by mouth at bedtime.     simvastatin (ZOCOR) 40 MG tablet Take 40 mg by mouth at bedtime.     timolol (TIMOPTIC) 0.5 % ophthalmic solution 1 drop 2 (two) times daily.     vitamin B-12 (CYANOCOBALAMIN) 100 MCG tablet Take 100 mcg by mouth daily.     vitamin E 1000 UNIT capsule Take 1,000 Units by mouth daily.     XARELTO 20 MG TABS tablet Take 1 tablet (20 mg total) by mouth daily with supper. 90 tablet 1   No current facility-administered medications for this visit.     Past Medical History:  Diagnosis Date   Aphasia    Atherosclerosis of aorta ([facility])     Atrial fibrillation ([facility])    CVA (cerebral vascular accident) ([place])    CVD (cardiovascular disease)    DJD (degenerative joint disease)    GERD (gastroesophageal reflux disease)    Hyperlipidemia    Hypertension    Incontinence    Macular degeneration    Memory change    Osteoporosis    Stroke ([place]) 02/2014    Past Surgical History:  Procedure Laterality Date   TONSILLECTOMY     Varicose veins      Social History   Socioeconomic History   Marital status: Divorced    Spouse name: Not on file   Number of children: 10   Years of education: Not on file   Highest education level: Not on file  Occupational History   Not on file  Tobacco Use   Smoking status: Never   Smokeless tobacco: Never  Vaping Use   Vaping Use: Never used  Substance and Sexual Activity   Alcohol use: Yes    Comment: Rare   Drug use: Never   Sexual activity: Not on file  Other Topics Concern   Not on file  Social History Narrative   Coffee 2 daily   Social Determinants of Health   Financial Resource Strain: Not on file  Food Insecurity: Not on file  Transportation Needs: Not on file  Physical Activity: Not  on file  Stress: Not on file  Social Connections: Not on file  Intimate Partner Violence: Not on file    Family History  Problem Relation Age of Onset   Stroke Father    Memory loss Neg Hx     ROS: Pain in left foot but no fevers or chills, productive cough, hemoptysis, dysphasia, odynophagia, melena, hematochezia, dysuria, hematuria, rash, seizure activity, orthopnea, PND, pedal edema, claudication. Remaining systems are negative.  Physical Exam: Well-developed well-nourished in no acute distress.  Skin is warm and dry.  HEENT is normal.  Neck is supple.  Chest is clear to auscultation with normal expansion.  Cardiovascular exam is irregular Abdominal exam nontender or distended. No masses palpated. Extremities show no edema. neuro grossly intact  A/P  1 permanent atrial  fibrillation-heart rate is controlled on the medications.  Continue Xarelto.  2 hypertension-patient's blood pressure is elevated; I have asked them to have her blood pressure checked at assisted living.  If it remains elevated we will add additional medications.  3 history of dyspnea-previous echocardiogram showed normal LV function.  No change in symptoms.  4 hyperlipidemia-continue statin.  Kirk Ruths, MD

## 2021-06-14 ENCOUNTER — Encounter: Payer: Self-pay | Admitting: Cardiology

## 2021-06-14 ENCOUNTER — Ambulatory Visit: Payer: Medicare Other | Admitting: Cardiology

## 2021-06-14 ENCOUNTER — Other Ambulatory Visit: Payer: Self-pay

## 2021-06-14 VITALS — BP 189/75 | HR 51 | Ht 65.0 in | Wt 181.8 lb

## 2021-06-14 DIAGNOSIS — I10 Essential (primary) hypertension: Secondary | ICD-10-CM | POA: Diagnosis not present

## 2021-06-14 DIAGNOSIS — E7800 Pure hypercholesterolemia, unspecified: Secondary | ICD-10-CM | POA: Diagnosis not present

## 2021-06-14 DIAGNOSIS — I4821 Permanent atrial fibrillation: Secondary | ICD-10-CM | POA: Diagnosis not present

## 2021-06-14 NOTE — Patient Instructions (Signed)
  Follow-Up: At [facility], you and your health needs are our priority.  As part of our continuing mission to provide you with exceptional heart care, we have created designated Provider Care Teams.  These Care Teams include your primary Cardiologist (physician) and Advanced Practice Providers (APPs -  Physician Assistants and Nurse Practitioners) who all work together to provide you with the care you need, when you need it.  We recommend signing up for the patient portal called "MyChart".  Sign up information is provided on this After Visit Summary.  MyChart is used to connect with patients for Virtual Visits (Telemedicine).  Patients are able to view lab/test results, encounter notes, upcoming appointments, etc.  Non-urgent messages can be sent to your provider as well.   To learn more about what you can do with MyChart, go to [email or web address].    Your next appointment:   6 month(s)  The format for your next appointment:   In Person  Provider:   Kirk Ruths, MD   Other Instructions CHECK BLOOD PRESSURE 2X WEEK

## 2021-06-16 DIAGNOSIS — Z741 Need for assistance with personal care: Secondary | ICD-10-CM | POA: Diagnosis not present

## 2021-06-16 DIAGNOSIS — F01B0 Vascular dementia, moderate, without behavioral disturbance, psychotic disturbance, mood disturbance, and anxiety: Secondary | ICD-10-CM | POA: Diagnosis not present

## 2021-06-16 DIAGNOSIS — K219 Gastro-esophageal reflux disease without esophagitis: Secondary | ICD-10-CM | POA: Diagnosis not present

## 2021-06-16 DIAGNOSIS — G629 Polyneuropathy, unspecified: Secondary | ICD-10-CM | POA: Diagnosis not present

## 2021-06-16 DIAGNOSIS — I10 Essential (primary) hypertension: Secondary | ICD-10-CM | POA: Diagnosis not present

## 2021-06-17 ENCOUNTER — Other Ambulatory Visit: Payer: Self-pay | Admitting: Cardiology

## 2021-06-17 DIAGNOSIS — I4821 Permanent atrial fibrillation: Secondary | ICD-10-CM

## 2021-06-19 ENCOUNTER — Telehealth: Payer: Self-pay | Admitting: Cardiology

## 2021-06-19 NOTE — Telephone Encounter (Signed)
The patient's son called to talk with Dr. [name] about the patient. Please give call back

## 2021-06-19 NOTE — Telephone Encounter (Signed)
Spoke with pt's son Rebecca Cannon (ok per DPR) regarding pt being on amlodipine. Son noticed that pt is still on amlodipine after back in June it was switched to losartan but then put back on amlodipine however the pt has adverse reactions to amlodipine including headaches and gagging.  Pt's son would like the pt to be placed on another medication besides amlodipine. Pt was recently seen in the office on 06/14/21, however pt's son states that he forgot to discuss with Dr. [name]. Will send to provider to advise.

## 2021-06-20 MED ORDER — FUROSEMIDE 20 MG PO TABS: 20.0000 mg | ORAL_TABLET | Freq: Every day | ORAL | 3 refills | Status: AC

## 2021-06-20 NOTE — Telephone Encounter (Signed)
son would like to inform that his mom is also taking zoloft 25mg .

## 2021-06-20 NOTE — Telephone Encounter (Signed)
Spoke with pt son, Aware of dr Jacalyn Lefevre recommendations. He reports her medical doctor started her on furosemide 20 mg daily last month due to the swelling. He reports she is having the gagging problems from the amlodipine as she did before. He reports she has a follow up appointment for lab work. She is currently in an assisted living facility and will call and let them know to stop the amlodipine. Aware will forward to dr [name] to confirm starting chlorthalidone since we did not know she was on furosemide.

## 2021-06-20 NOTE — Telephone Encounter (Signed)
Spoke with pt son, aware will fax orders to stop the amlodipine to [facility] and ask them to check her blood pressure once daily for 2 weeks and fax the results to [place].

## 2021-06-28 DIAGNOSIS — N3281 Overactive bladder: Secondary | ICD-10-CM | POA: Diagnosis not present

## 2021-06-28 DIAGNOSIS — E782 Mixed hyperlipidemia: Secondary | ICD-10-CM | POA: Diagnosis not present

## 2021-06-28 DIAGNOSIS — K219 Gastro-esophageal reflux disease without esophagitis: Secondary | ICD-10-CM | POA: Diagnosis not present

## 2021-06-30 DIAGNOSIS — E559 Vitamin D deficiency, unspecified: Secondary | ICD-10-CM | POA: Diagnosis not present

## 2021-06-30 DIAGNOSIS — E782 Mixed hyperlipidemia: Secondary | ICD-10-CM | POA: Diagnosis not present

## 2021-06-30 DIAGNOSIS — E119 Type 2 diabetes mellitus without complications: Secondary | ICD-10-CM | POA: Diagnosis not present

## 2021-06-30 DIAGNOSIS — E039 Hypothyroidism, unspecified: Secondary | ICD-10-CM | POA: Diagnosis not present

## 2021-06-30 DIAGNOSIS — I10 Essential (primary) hypertension: Secondary | ICD-10-CM | POA: Diagnosis not present

## 2021-07-03 DIAGNOSIS — H401132 Primary open-angle glaucoma, bilateral, moderate stage: Secondary | ICD-10-CM | POA: Diagnosis not present

## 2021-07-22 ENCOUNTER — Emergency Department (HOSPITAL_BASED_OUTPATIENT_CLINIC_OR_DEPARTMENT_OTHER): Payer: Medicare Other

## 2021-07-22 ENCOUNTER — Other Ambulatory Visit: Payer: Self-pay

## 2021-07-22 ENCOUNTER — Inpatient Hospital Stay (HOSPITAL_BASED_OUTPATIENT_CLINIC_OR_DEPARTMENT_OTHER)
Admission: EM | Admit: 2021-07-22 | Discharge: 2021-07-26 | DRG: 309 | Disposition: A | Discharge status: Skilled Nursing Facility | Payer: Medicare Other | Attending: Internal Medicine | Admitting: Internal Medicine

## 2021-07-22 ENCOUNTER — Encounter (HOSPITAL_BASED_OUTPATIENT_CLINIC_OR_DEPARTMENT_OTHER): Payer: Self-pay | Admitting: *Deleted

## 2021-07-22 DIAGNOSIS — Z823 Family history of stroke: Secondary | ICD-10-CM | POA: Diagnosis not present

## 2021-07-22 DIAGNOSIS — Z66 Do not resuscitate: Secondary | ICD-10-CM | POA: Diagnosis not present

## 2021-07-22 DIAGNOSIS — M810 Age-related osteoporosis without current pathological fracture: Secondary | ICD-10-CM | POA: Diagnosis present

## 2021-07-22 DIAGNOSIS — W1830XA Fall on same level, unspecified, initial encounter: Secondary | ICD-10-CM | POA: Diagnosis present

## 2021-07-22 DIAGNOSIS — K219 Gastro-esophageal reflux disease without esophagitis: Secondary | ICD-10-CM | POA: Diagnosis not present

## 2021-07-22 DIAGNOSIS — S0990XA Unspecified injury of head, initial encounter: Secondary | ICD-10-CM | POA: Diagnosis not present

## 2021-07-22 DIAGNOSIS — R55 Syncope and collapse: Secondary | ICD-10-CM | POA: Diagnosis present

## 2021-07-22 DIAGNOSIS — Z91048 Other nonmedicinal substance allergy status: Secondary | ICD-10-CM

## 2021-07-22 DIAGNOSIS — S2249XA Multiple fractures of ribs, unspecified side, initial encounter for closed fracture: Secondary | ICD-10-CM | POA: Diagnosis present

## 2021-07-22 DIAGNOSIS — Z8673 Personal history of transient ischemic attack (TIA), and cerebral infarction without residual deficits: Secondary | ICD-10-CM

## 2021-07-22 DIAGNOSIS — Y92009 Unspecified place in unspecified non-institutional (private) residence as the place of occurrence of the external cause: Secondary | ICD-10-CM | POA: Diagnosis not present

## 2021-07-22 DIAGNOSIS — S2241XA Multiple fractures of ribs, right side, initial encounter for closed fracture: Secondary | ICD-10-CM | POA: Diagnosis not present

## 2021-07-22 DIAGNOSIS — D6859 Other primary thrombophilia: Secondary | ICD-10-CM | POA: Diagnosis not present

## 2021-07-22 DIAGNOSIS — I4821 Permanent atrial fibrillation: Principal | ICD-10-CM | POA: Diagnosis present

## 2021-07-22 DIAGNOSIS — D6869 Other thrombophilia: Secondary | ICD-10-CM | POA: Diagnosis not present

## 2021-07-22 DIAGNOSIS — R911 Solitary pulmonary nodule: Secondary | ICD-10-CM | POA: Diagnosis present

## 2021-07-22 DIAGNOSIS — I4891 Unspecified atrial fibrillation: Secondary | ICD-10-CM

## 2021-07-22 DIAGNOSIS — J9811 Atelectasis: Secondary | ICD-10-CM | POA: Diagnosis not present

## 2021-07-22 DIAGNOSIS — G319 Degenerative disease of nervous system, unspecified: Secondary | ICD-10-CM | POA: Diagnosis not present

## 2021-07-22 DIAGNOSIS — W19XXXA Unspecified fall, initial encounter: Secondary | ICD-10-CM | POA: Diagnosis not present

## 2021-07-22 DIAGNOSIS — K5790 Diverticulosis of intestine, part unspecified, without perforation or abscess without bleeding: Secondary | ICD-10-CM

## 2021-07-22 DIAGNOSIS — Z7901 Long term (current) use of anticoagulants: Secondary | ICD-10-CM

## 2021-07-22 DIAGNOSIS — H3530 Unspecified macular degeneration: Secondary | ICD-10-CM | POA: Diagnosis present

## 2021-07-22 DIAGNOSIS — Z20822 Contact with and (suspected) exposure to covid-19: Secondary | ICD-10-CM | POA: Diagnosis not present

## 2021-07-22 DIAGNOSIS — M6281 Muscle weakness (generalized): Secondary | ICD-10-CM | POA: Diagnosis not present

## 2021-07-22 DIAGNOSIS — I700 Atherosclerosis of aorta: Secondary | ICD-10-CM

## 2021-07-22 DIAGNOSIS — Z888 Allergy status to other drugs, medicaments and biological substances status: Secondary | ICD-10-CM | POA: Diagnosis not present

## 2021-07-22 DIAGNOSIS — R001 Bradycardia, unspecified: Secondary | ICD-10-CM | POA: Diagnosis not present

## 2021-07-22 DIAGNOSIS — M2550 Pain in unspecified joint: Secondary | ICD-10-CM | POA: Diagnosis not present

## 2021-07-22 DIAGNOSIS — E785 Hyperlipidemia, unspecified: Secondary | ICD-10-CM | POA: Diagnosis present

## 2021-07-22 DIAGNOSIS — R4189 Other symptoms and signs involving cognitive functions and awareness: Secondary | ICD-10-CM | POA: Diagnosis present

## 2021-07-22 DIAGNOSIS — M1990 Unspecified osteoarthritis, unspecified site: Secondary | ICD-10-CM | POA: Diagnosis not present

## 2021-07-22 DIAGNOSIS — I2510 Atherosclerotic heart disease of native coronary artery without angina pectoris: Secondary | ICD-10-CM | POA: Diagnosis present

## 2021-07-22 DIAGNOSIS — R339 Retention of urine, unspecified: Secondary | ICD-10-CM | POA: Diagnosis not present

## 2021-07-22 DIAGNOSIS — M47812 Spondylosis without myelopathy or radiculopathy, cervical region: Secondary | ICD-10-CM | POA: Diagnosis not present

## 2021-07-22 DIAGNOSIS — M47816 Spondylosis without myelopathy or radiculopathy, lumbar region: Secondary | ICD-10-CM | POA: Diagnosis not present

## 2021-07-22 DIAGNOSIS — I10 Essential (primary) hypertension: Secondary | ICD-10-CM | POA: Diagnosis present

## 2021-07-22 DIAGNOSIS — R262 Difficulty in walking, not elsewhere classified: Secondary | ICD-10-CM | POA: Diagnosis not present

## 2021-07-22 DIAGNOSIS — M4314 Spondylolisthesis, thoracic region: Secondary | ICD-10-CM | POA: Diagnosis not present

## 2021-07-22 DIAGNOSIS — Z9102 Food additives allergy status: Secondary | ICD-10-CM | POA: Diagnosis not present

## 2021-07-22 DIAGNOSIS — Z79899 Other long term (current) drug therapy: Secondary | ICD-10-CM | POA: Diagnosis not present

## 2021-07-22 DIAGNOSIS — S2241XD Multiple fractures of ribs, right side, subsequent encounter for fracture with routine healing: Secondary | ICD-10-CM | POA: Diagnosis not present

## 2021-07-22 DIAGNOSIS — G629 Polyneuropathy, unspecified: Secondary | ICD-10-CM | POA: Diagnosis not present

## 2021-07-22 DIAGNOSIS — K4090 Unilateral inguinal hernia, without obstruction or gangrene, not specified as recurrent: Secondary | ICD-10-CM | POA: Diagnosis not present

## 2021-07-22 DIAGNOSIS — K5730 Diverticulosis of large intestine without perforation or abscess without bleeding: Secondary | ICD-10-CM | POA: Diagnosis not present

## 2021-07-22 DIAGNOSIS — K8020 Calculus of gallbladder without cholecystitis without obstruction: Secondary | ICD-10-CM | POA: Diagnosis not present

## 2021-07-22 DIAGNOSIS — M4184 Other forms of scoliosis, thoracic region: Secondary | ICD-10-CM | POA: Diagnosis not present

## 2021-07-22 DIAGNOSIS — M4312 Spondylolisthesis, cervical region: Secondary | ICD-10-CM | POA: Diagnosis not present

## 2021-07-22 DIAGNOSIS — Z7401 Bed confinement status: Secondary | ICD-10-CM | POA: Diagnosis not present

## 2021-07-22 DIAGNOSIS — R278 Other lack of coordination: Secondary | ICD-10-CM | POA: Diagnosis not present

## 2021-07-22 DIAGNOSIS — Z043 Encounter for examination and observation following other accident: Secondary | ICD-10-CM | POA: Diagnosis not present

## 2021-07-22 LAB — CBC WITH DIFFERENTIAL/PLATELET
Abs Immature Granulocytes: 0.02 10*3/uL (ref 0.00–0.07)
Basophils Absolute: 0 10*3/uL (ref 0.0–0.1)
Basophils Relative: 0 %
Eosinophils Absolute: 0.1 10*3/uL (ref 0.0–0.5)
Eosinophils Relative: 1 %
HCT: 41.2 % (ref 36.0–46.0)
Hemoglobin: 14 g/dL (ref 12.0–15.0)
Immature Granulocytes: 0 %
Lymphocytes Relative: 19 %
Lymphs Abs: 1.6 10*3/uL (ref 0.7–4.0)
MCH: 31.4 pg (ref 26.0–34.0)
MCHC: 34 g/dL (ref 30.0–36.0)
MCV: 92.4 fL (ref 80.0–100.0)
Monocytes Absolute: 0.8 10*3/uL (ref 0.1–1.0)
Monocytes Relative: 10 %
Neutro Abs: 5.8 10*3/uL (ref 1.7–7.7)
Neutrophils Relative %: 70 %
Platelets: 286 10*3/uL (ref 150–400)
RBC: 4.46 MIL/uL (ref 3.87–5.11)
RDW: 12.9 % (ref 11.5–15.5)
WBC: 8.3 10*3/uL (ref 4.0–10.5)
nRBC: 0 % (ref 0.0–0.2)

## 2021-07-22 LAB — BASIC METABOLIC PANEL
Anion gap: 10 (ref 5–15)
BUN: 13 mg/dL (ref 8–23)
CO2: 25 mmol/L (ref 22–32)
Calcium: 9.1 mg/dL (ref 8.9–10.3)
Chloride: 100 mmol/L (ref 98–111)
Creatinine, Ser: 1.01 mg/dL — ABNORMAL HIGH (ref 0.44–1.00)
GFR, Estimated: 53 mL/min — ABNORMAL LOW (ref >60)
Glucose, Bld: 141 mg/dL — ABNORMAL HIGH (ref 70–99)
Potassium: 3.7 mmol/L (ref 3.5–5.1)
Sodium: 135 mmol/L (ref 135–145)

## 2021-07-22 LAB — TROPONIN I (HIGH SENSITIVITY)
Troponin I (High Sensitivity): 11 ng/L (ref <18)
Troponin I (High Sensitivity): 13 ng/L (ref <18)

## 2021-07-22 LAB — RESP PANEL BY RT-PCR (FLU A&B, COVID) ARPGX2
Influenza A by PCR: NEGATIVE
Influenza B by PCR: NEGATIVE
SARS Coronavirus 2 by RT PCR: NEGATIVE

## 2021-07-22 MED ORDER — ONDANSETRON HCL 4 MG PO TABS: 4.0000 mg | ORAL_TABLET | Freq: Four times a day (QID) | ORAL | Status: DC | PRN

## 2021-07-22 MED ORDER — SODIUM CHLORIDE 0.9% FLUSH
3.0000 mL | Freq: Two times a day (BID) | INTRAVENOUS | Status: DC
  Administered 2021-07-22 – 2021-07-26 (×6): 3 mL via INTRAVENOUS

## 2021-07-22 MED ORDER — SIMVASTATIN 20 MG PO TABS
40.0000 mg | ORAL_TABLET | Freq: Every day | ORAL | Status: DC
  Administered 2021-07-22 – 2021-07-25 (×4): 40 mg via ORAL
  Filled 2021-07-22 (×3): qty 2
  Filled 2021-07-22: qty 1

## 2021-07-22 MED ORDER — RIVAROXABAN 15 MG PO TABS: 15.0000 mg | ORAL_TABLET | Freq: Every day | ORAL | Status: DC

## 2021-07-22 MED ORDER — ACETAMINOPHEN 500 MG PO TABS
1000.0000 mg | ORAL_TABLET | Freq: Four times a day (QID) | ORAL | Status: DC | PRN
  Administered 2021-07-22: 1000 mg via ORAL
  Filled 2021-07-22: qty 2

## 2021-07-22 MED ORDER — MORPHINE SULFATE (PF) 2 MG/ML IV SOLN: 1.0000 mg | Freq: Once | INTRAVENOUS | Status: DC | PRN

## 2021-07-22 MED ORDER — LOSARTAN POTASSIUM 50 MG PO TABS
100.0000 mg | ORAL_TABLET | Freq: Every day | ORAL | Status: DC
  Administered 2021-07-22: 100 mg via ORAL
  Filled 2021-07-22: qty 4

## 2021-07-22 MED ORDER — HYDROCODONE-ACETAMINOPHEN 5-325 MG PO TABS
1.0000 | ORAL_TABLET | Freq: Once | ORAL | Status: AC
  Administered 2021-07-22: 1 via ORAL
  Filled 2021-07-22: qty 1

## 2021-07-22 MED ORDER — ONDANSETRON HCL 4 MG/2ML IJ SOLN
4.0000 mg | Freq: Four times a day (QID) | INTRAMUSCULAR | Status: DC | PRN
  Administered 2021-07-26: 4 mg via INTRAVENOUS
  Filled 2021-07-22: qty 2

## 2021-07-22 MED ORDER — TIMOLOL MALEATE 0.5 % OP SOLN
1.0000 [drp] | Freq: Two times a day (BID) | OPHTHALMIC | Status: DC
  Administered 2021-07-22 – 2021-07-23 (×2): 1 [drp] via OPHTHALMIC
  Filled 2021-07-22: qty 5

## 2021-07-22 MED ORDER — QUETIAPINE FUMARATE 25 MG PO TABS
25.0000 mg | ORAL_TABLET | Freq: Every day | ORAL | Status: DC
  Administered 2021-07-22: 25 mg via ORAL
  Filled 2021-07-22: qty 1

## 2021-07-22 MED ORDER — OXYCODONE HCL 5 MG PO TABS
5.0000 mg | ORAL_TABLET | ORAL | Status: DC | PRN
  Administered 2021-07-23: 5 mg via ORAL
  Filled 2021-07-22: qty 1

## 2021-07-22 MED ORDER — LACTATED RINGERS IV SOLN: INTRAVENOUS | Status: DC

## 2021-07-22 MED ORDER — FUROSEMIDE 20 MG PO TABS: 20.0000 mg | ORAL_TABLET | Freq: Every day | ORAL | Status: DC

## 2021-07-22 MED ORDER — SENNOSIDES-DOCUSATE SODIUM 8.6-50 MG PO TABS: 1.0000 | ORAL_TABLET | Freq: Every evening | ORAL | Status: DC | PRN

## 2021-07-22 NOTE — ED Provider Notes (Signed)
[facility] EMERGENCY DEPARTMENT Provider Note   CSN: [id number] Arrival date & time: 07/22/21  1522     History Chief Complaint  Patient presents with   Lytle Michaels    Rebecca Cannon is a 87 y.o. female.   Fall Associated symptoms include chest pain. Pertinent negatives include no abdominal pain and no shortness of breath.   87 year old female with a history of atrial fibrillation on Xarelto, prior CVA, HTN, HLD, osteoporosis presenting to the emergency department after a fall on Thursday.  The patient experienced a fall and questionable syncopal episode on Thursday.  She does not remember all the details of the fall which suggest syncope.  She believes that she was walking at her facility at [facility] and the next thing that she remembers she was on the floor.  Since the fall, she has had right-sided rib pain that is sharp, shooting, moderate in severity, worsened with taking a deep breath.  Endorses midthoracic back pain.  Pain is worse with movement.  No further episodes of syncope.  No further falls.  No other complaints.  No neurologic deficits.  Past Medical History:  Diagnosis Date   Aphasia    Atherosclerosis of aorta (HCC)    Atrial fibrillation (HCC)    CVA (cerebral vascular accident) ([place])    CVD (cardiovascular disease)    DJD (degenerative joint disease)    GERD (gastroesophageal reflux disease)    Hyperlipidemia    Hypertension    Incontinence    Macular degeneration    Memory change    Osteoporosis    Stroke ([place]) 02/2014    Patient Active Problem List   Diagnosis Date Noted   Syncope 07/22/2021    Past Surgical History:  Procedure Laterality Date   TONSILLECTOMY     Varicose veins       OB History   No obstetric history on file.     Family History  Problem Relation Age of Onset   Stroke Father    Memory loss Neg Hx     Social History   Tobacco Use   Smoking status: Never   Smokeless tobacco: Never  Vaping Use   Vaping Use:  Never used  Substance Use Topics   Alcohol use: Yes    Comment: Rare   Drug use: Never    Home Medications Prior to Admission medications   Medication Sig Start Date End Date Taking? Authorizing Provider  dorzolamide (TRUSOPT) 2 % ophthalmic solution 1 drop 3 (three) times daily. 04/24/21  Yes [provider]  furosemide (LASIX) 20 MG tablet Take 1 tablet (20 mg total) by mouth daily. 06/20/21 09/18/21 Yes Lelon Perla, MD  losartan (COZAAR) 100 MG tablet TAKE 1 TABLET BY MOUTH  DAILY 05/11/21  Yes Almyra Deforest, PA  omeprazole (PRILOSEC) 20 MG capsule Take 20 mg by mouth daily. 05/17/19  Yes [provider]  QUEtiapine (SEROQUEL) 25 MG tablet Take 25-50 mg by mouth at bedtime. 04/18/21  Yes [provider]  simvastatin (ZOCOR) 40 MG tablet Take 40 mg by mouth at bedtime. 05/17/19  Yes [provider]  timolol (TIMOPTIC) 0.5 % ophthalmic solution 1 drop 2 (two) times daily. 06/18/19  Yes [provider]  vitamin B-12 (CYANOCOBALAMIN) 100 MCG tablet Take 100 mcg by mouth daily.   Yes [provider]  vitamin E 1000 UNIT capsule Take 1,000 Units by mouth daily.   Yes [provider]  XARELTO 20 MG TABS tablet Take 1 tablet (20 mg total) by  mouth daily with supper. 06/19/21  Yes Lelon Perla, MD  folic acid (FOLVITE) 299 MCG tablet 1 tablet    [provider]  Omega-3 Fatty Acids (FISH OIL) 1000 MG CAPS Take by mouth.    [provider]  oxybutynin (DITROPAN) 5 MG tablet  05/17/19   [provider]    Allergies    Amlodipine, Iodine, Cefprozil, Mirabegron, and Peppermint flavor  Review of Systems   Review of Systems  Constitutional:  Negative for chills and fever.  HENT:  Negative for ear pain and sore throat.   Eyes:  Negative for pain and visual disturbance.  Respiratory:  Negative for cough and shortness of breath.   Cardiovascular:  Positive for chest pain. Negative for palpitations.   Gastrointestinal:  Negative for abdominal pain and vomiting.  Genitourinary:  Negative for dysuria and hematuria.  Musculoskeletal:  Positive for back pain. Negative for arthralgias.  Skin:  Negative for color change and rash.  Neurological:  Negative for seizures and syncope.  All other systems reviewed and are negative.  Physical Exam Updated Vital Signs BP (!) 163/108   Pulse (!) 37   Temp 98.2 F (36.8 C) (Oral)   Resp 18   Ht 5\' 5"  (1.651 m)   Wt 86.2 kg   SpO2 92%   BMI 31.62 kg/m   Physical Exam Vitals and nursing note reviewed.  Constitutional:      General: She is not in acute distress.    Appearance: She is well-developed.     Comments: GCS 15, ABC intact  HENT:     Head: Normocephalic and atraumatic.  Eyes:     Extraocular Movements: Extraocular movements intact.     Conjunctiva/sclera: Conjunctivae normal.     Pupils: Pupils are equal, round, and reactive to light.  Neck:     Comments: No midline tenderness to palpation of the cervical spine.  Range of motion intact Cardiovascular:     Rate and Rhythm: Bradycardia present. Rhythm irregular.     Heart sounds: No murmur heard. Pulmonary:     Effort: Pulmonary effort is normal. No respiratory distress.     Breath sounds: Normal breath sounds.  Chest:     Comments: Clavicles stable nontender to AP compression.  Chest wall stable and with right-sided chest wall tenderness to palpation.  Abdominal:     Palpations: Abdomen is soft.     Tenderness: There is no abdominal tenderness.  Musculoskeletal:     Cervical back: Neck supple.     Comments:  Extremities atraumatic with intact range of motion.  Mid thoracic tenderness with no step-offs. No lumbar tenderness to palpation.   Skin:    General: Skin is warm and dry.  Neurological:     Mental Status: She is alert.     Comments: Cranial nerves II through XII grossly intact.  Moving all 4 extremities spontaneously.  Sensation grossly intact all 4 extremities     ED Results / Procedures / Treatments   Labs (all labs ordered are listed, but only abnormal results are displayed) Labs Reviewed  BASIC METABOLIC PANEL - Abnormal; Notable for the following components:      Result Value   Glucose, Bld 141 (*)    Creatinine, Ser 1.01 (*)    GFR, Estimated 53 (*)    All other components within normal limits  RESP PANEL BY RT-PCR (FLU A&B, COVID) [id number]  CBC WITH DIFFERENTIAL/PLATELET  TROPONIN I (HIGH SENSITIVITY)  TROPONIN I (HIGH SENSITIVITY)  EKG EKG Interpretation  Date/Time:  Saturday July 22 2021 15:57:51 EST Ventricular Rate:  58 PR Interval:    QRS Duration: 89 QT Interval:  439 QTC Calculation: 432 R Axis:   -39 Text Interpretation: Atrial fibrillation Left axis deviation Anteroseptal infarct, age indeterminate Confirmed by Regan Lemming ([id number]) on 07/22/2021 4:19:41 PM  Radiology DG Chest 2 View  Result Date: 07/22/2021 CLINICAL DATA:  Fall. EXAM: CHEST - 2 VIEW COMPARISON:  None. FINDINGS: Patient is slightly rotated. Cardiomediastinal silhouette is within normal limits. There is minimal atelectasis in the left lung base. The lungs are otherwise clear. There is no pleural effusion or pneumothorax. The aorta is ectatic. No acute fractures are identified. IMPRESSION: No active cardiopulmonary disease. Electronically Signed   By: Ronney Asters M.D.   On: 07/22/2021 16:37   CT Head Wo Contrast  Result Date: 07/22/2021 CLINICAL DATA:  Recent fall. EXAM: CT HEAD WITHOUT CONTRAST CT CERVICAL SPINE WITHOUT CONTRAST TECHNIQUE: Multidetector CT imaging of the head and cervical spine was performed following the standard protocol without intravenous contrast. Multiplanar CT image reconstructions of the cervical spine were also generated. COMPARISON:  MRI brain dated March 04, 2021. FINDINGS: CT HEAD FINDINGS Brain: No evidence of acute infarction, hemorrhage, hydrocephalus, extra-axial collection or mass lesion/mass effect. Old left  parieto-occipital lobe infarct again noted. Old lacunar infarcts in the left and right cerebellum. Stable atrophy and chronic microvascular ischemic changes. Vascular: Calcified atherosclerosis at the skull base. No hyperdense vessel. Skull: Negative for fracture or focal lesion. Sinuses/Orbits: No acute finding. Other: None. CT CERVICAL SPINE FINDINGS Alignment: No traumatic malalignment. Straightening of the normal cervical lordosis. Trace anterolisthesis at C2-C3 and C7-T1. Skull base and vertebrae: No acute fracture. No primary bone lesion or focal pathologic process. Soft tissues and spinal canal: No prevertebral fluid or swelling. No visible canal hematoma. Disc levels: Multilevel disc height loss, severe at C5-C6 and C6-C7. Diffuse severe facet arthropathy throughout the cervical spine. Prior ankylosis of the bilateral C3-C4 and C4-C5 facet joints. Upper chest: Negative. Other: None. IMPRESSION: 1. No acute intracranial abnormality.  Multiple old infarcts. 2. No acute cervical spine fracture or traumatic malalignment. Advanced multilevel cervical spondylosis. Electronically Signed   By: Titus Dubin M.D.   On: 07/22/2021 16:51   CT Cervical Spine Wo Contrast  Result Date: 07/22/2021 CLINICAL DATA:  Recent fall. EXAM: CT HEAD WITHOUT CONTRAST CT CERVICAL SPINE WITHOUT CONTRAST TECHNIQUE: Multidetector CT imaging of the head and cervical spine was performed following the standard protocol without intravenous contrast. Multiplanar CT image reconstructions of the cervical spine were also generated. COMPARISON:  MRI brain dated March 04, 2021. FINDINGS: CT HEAD FINDINGS Brain: No evidence of acute infarction, hemorrhage, hydrocephalus, extra-axial collection or mass lesion/mass effect. 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 Old lacunar infarcts in the left and right cerebellum. Stable atrophy and chronic microvascular ischemic changes. Vascular: Calcified atherosclerosis at the skull base. No  hyperdense vessel. Skull: Negative for fracture or focal lesion. Sinuses/Orbits: No acute finding. Other: None. CT CERVICAL SPINE FINDINGS Alignment: No traumatic malalignment. Straightening of the normal cervical lordosis. Trace anterolisthesis at C2-C3 and C7-T1. Skull base and vertebrae: No acute fracture. No primary bone lesion or focal pathologic process. Soft tissues and spinal canal: No prevertebral fluid or swelling. No visible canal hematoma. Disc levels: Multilevel disc height loss, severe at C5-C6 and C6-C7. Diffuse severe facet arthropathy throughout the cervical spine. Prior ankylosis of the bilateral C3-C4 and C4-C5 facet joints. Upper chest: Negative. Other: None. IMPRESSION: 1.  No acute intracranial abnormality.  Multiple old infarcts. 2. No acute cervical spine fracture or traumatic malalignment. Advanced multilevel cervical spondylosis. Electronically Signed   By: Titus Dubin M.D.   On: 07/22/2021 16:51   CT T-SPINE NO CHARGE  Result Date: 07/22/2021 CLINICAL DATA:  Fall. EXAM: CT THORACIC AND LUMBAR SPINE WITHOUT CONTRAST TECHNIQUE: Multiplanar CT images of the thoracic and lumbar spine were reconstructed from contemporary CT of the Chest, Abdomen, and Pelvis CONTRAST:  None. COMPARISON:  None. FINDINGS: CT THORACIC SPINE FINDINGS Alignment: Mild dextroscoliosis. Trace stepwise anterolisthesis from T1-T2 through T3-T4. No traumatic malalignment. Vertebrae: No acute fracture or focal pathologic process. Paraspinal and other soft tissues: Please see separate CT chest, abdomen, and pelvis report from same day. Disc levels: Mild multilevel disc height loss, anterior endplate spurring, and facet arthropathy. CT LUMBAR SPINE FINDINGS Segmentation: 5 lumbar type vertebrae. Alignment: Trace anterolisthesis at L3-L4. 7 mm anterolisthesis at L4-L5. Vertebrae: No acute fracture or focal pathologic process. Chronic moderate L4 compression deformity. Paraspinal and other soft tissues: Please see  separate CT chest, abdomen, and pelvis report from same day. Disc levels: Multilevel disc height loss, moderate to severe at L5-S1. Mild-to-moderate lumbar facet arthropathy. At least moderate spinal canal stenosis at L4-L5. Moderate right neuroforaminal stenosis at L2-L3. IMPRESSION: 1. No acute fracture of the thoracic or lumbar spine. 2. Chronic moderate L4 compression deformity. Electronically Signed   By: Titus Dubin M.D.   On: 07/22/2021 18:06   CT L-SPINE NO CHARGE  Result Date: 07/22/2021 CLINICAL DATA:  Fall. EXAM: CT THORACIC AND LUMBAR SPINE WITHOUT CONTRAST TECHNIQUE: Multiplanar CT images of the thoracic and lumbar spine were reconstructed from contemporary CT of the Chest, Abdomen, and Pelvis CONTRAST:  None. COMPARISON:  None. FINDINGS: CT THORACIC SPINE FINDINGS Alignment: Mild dextroscoliosis. Trace stepwise anterolisthesis from T1-T2 through T3-T4. No traumatic malalignment. Vertebrae: No acute fracture or focal pathologic process. Paraspinal and other soft tissues: Please see separate CT chest, abdomen, and pelvis report from same day. Disc levels: Mild multilevel disc height loss, anterior endplate spurring, and facet arthropathy. CT LUMBAR SPINE FINDINGS Segmentation: 5 lumbar type vertebrae. Alignment: Trace anterolisthesis at L3-L4. 7 mm anterolisthesis at L4-L5. Vertebrae: No acute fracture or focal pathologic process. Chronic moderate L4 compression deformity. Paraspinal and other soft tissues: Please see separate CT chest, abdomen, and pelvis report from same day. Disc levels: Multilevel disc height loss, moderate to severe at L5-S1. Mild-to-moderate lumbar facet arthropathy. At least moderate spinal canal stenosis at L4-L5. Moderate right neuroforaminal stenosis at L2-L3. IMPRESSION: 1. No acute fracture of the thoracic or lumbar spine. 2. Chronic moderate L4 compression deformity. Electronically Signed   By: Titus Dubin M.D.   On: 07/22/2021 18:06   CT CHEST ABDOMEN PELVIS  WO CONTRAST  Result Date: 07/22/2021 CLINICAL DATA:  Rib pain after fall. EXAM: CT CHEST, ABDOMEN AND PELVIS WITHOUT CONTRAST TECHNIQUE: Multidetector CT imaging of the chest, abdomen and pelvis was performed following the standard protocol without IV contrast. COMPARISON:  None. FINDINGS: CT CHEST FINDINGS Cardiovascular: Mild cardiomegaly with left atrial enlargement. No pericardial effusion. No thoracic aortic aneurysm. Coronary, aortic arch, and branch vessel atherosclerotic vascular disease. Mediastinum/Nodes: Mildly enlarged precarinal lymph node measuring 1.2 cm in short axis, likely reactive. No other enlarged mediastinal lymph nodes. No enlarged axillary lymph nodes. The thyroid gland, trachea, and esophagus demonstrate no significant findings. Lungs/Pleura: No focal consolidation, pleural effusion, or pneumothorax. Irregular 11 x 8 mm subpleural pulmonary nodule in the right lower lobe (series 4, image  39). Calcified left apical pleuroparenchymal scarring. Musculoskeletal: Acute nondisplaced fracture of the right lateral sixth rib. Acute minimally displaced fracture of right posterior eighth rib. CT ABDOMEN PELVIS FINDINGS Hepatobiliary: Multiple hepatic cysts measuring up to 2.5 cm. Two large gallstones. No gallbladder wall thickening or biliary dilatation. Pancreas: Unremarkable. No pancreatic ductal dilatation or surrounding inflammatory changes. Spleen: Normal in size without focal abnormality. Adrenals/Urinary Tract: Adrenal glands are unremarkable. Kidneys are normal, without renal calculi, focal lesion, or hydronephrosis. Bladder is unremarkable. Stomach/Bowel: Stomach is within normal limits. Diminutive, normal appendix. No evidence of bowel wall thickening, distention, or inflammatory changes. Severe diffuse colonic diverticulosis. Vascular/Lymphatic: Aortic atherosclerosis. No enlarged abdominal or pelvic lymph nodes. Reproductive: Status post hysterectomy. No adnexal masses. Other: Small fat  containing right inguinal hernia. No free fluid or pneumoperitoneum. Musculoskeletal: No acute or significant osseous findings. Chronic L4 compression deformity. IMPRESSION: Chest: 1. Acute right lateral sixth and right posterior eighth rib fractures. No pneumothorax. 2. Irregular 11 x 8 mm subpleural pulmonary nodule in the right lower lobe. Consider a non-contrast Chest CT at 3 months, a PET/CT, or tissue sampling. These guidelines do not apply to immunocompromised patients and patients with cancer. Follow up in patients with significant comorbidities as clinically warranted. For lung cancer screening, adhere to Lung-RADS guidelines. Reference: Radiology. 2017; 284(1):228-43. Abdomen and pelvis: 1. No acute intra-abdominal process. 2. Cholelithiasis. 3. Severe diffuse colonic diverticulosis without evidence of acute diverticulitis. 4. Aortic Atherosclerosis (ICD10-I70.0). Electronically Signed   By: Titus Dubin M.D.   On: 07/22/2021 17:59    Procedures Procedures   Medications Ordered in ED Medications  simvastatin (ZOCOR) tablet 40 mg (has no administration in time range)  QUEtiapine (SEROQUEL) tablet 25-50 mg (has no administration in time range)  timolol (TIMOPTIC) 0.5 % ophthalmic solution 1 drop (has no administration in time range)  losartan (COZAAR) tablet 100 mg (has no administration in time range)  HYDROcodone-acetaminophen (NORCO/VICODIN) 5-325 MG per tablet 1 tablet (has no administration in time range)    ED Course  I have reviewed the triage vital signs and the nursing notes.  Pertinent labs & imaging results that were available during my care of the patient were reviewed by me and considered in my medical decision making (see chart for details).  Clinical Course as of 07/22/21 1936  Sat Jul 22, 2021  1932 Pulse Rate(!): 37 [JL]  1933 Troponin I (High Sensitivity): 11 [JL]  1933 CT CHEST ABDOMEN PELVIS WO CONTRAST [JL]    Clinical Course User Index [JL] Regan Lemming,  MD   MDM Rules/Calculators/A&P                            87 year old female with a history of atrial fibrillation on Xarelto, prior CVA, HTN, HLD, osteoporosis presenting to the emergency department after a fall on Thursday.  The patient experienced a fall and questionable syncopal episode on Thursday.  She does not remember all the details of the fall which suggest syncope.  She believes that she was walking at her facility at [facility] and the next thing that she remembers she was on the floor.  Since the fall, she has had right-sided rib pain that is sharp, shooting, moderate in severity, worsened with taking a deep breath.  Endorses midthoracic back pain.  Pain is worse with movement.  No further episodes of syncope.  No further falls.  No other complaints.  No neurologic deficits.  Given the patient's syncopal episode with resultant  fall, considered symptomatic bradycardia as the etiology of the patient's syncope.  The patient was in slow A. fib with heart rates in the 30s and 40s here in the ED, intermittently in the 60s and 70s.  She has a negative troponin.  She complains of no chest pain or shortness of breath anteriorly.  She does complain of rib pain along her right back.  A CT of the chest abdomen pelvis was performed which revealed 2 rib fractures in ribs 8 and 6 posteriorly with 1 nondisplaced the other being displaced.  Will attempt pain control with Norco.  Low suspicion for aortic dissection, ruptured aortic aneurysm, ACS at this time, low suspicion for PE.  The remainder the patient's trauma imaging to include CT of the head, cervical spine, chest abdomen pelvis, T and L reformats were negative for acute injuries or abnormalities.  Regarding the patient's bradycardia, she is not on a beta-blocker from what I can see on her home medication list.  She is in atrial fibrillation intermittently with slow ventricular response.  Given the patient's syncopal episode of unclear etiology,  atrial fibrillation with slow ventricular response, new rib fractures requiring likely pain control and close observation, hospitalist medicine was consulted for further evaluation and work-up.  Final Clinical Impression(s) / ED Diagnoses Final diagnoses:  Fall  Fall, initial encounter  Closed fracture of multiple ribs of right side, initial encounter  Atrial fibrillation with slow ventricular response [facility])    Rx / DC Orders ED Discharge Orders     None        Regan Lemming, MD 07/22/21 1936

## 2021-07-22 NOTE — ED Notes (Signed)
Patient transported to CT 

## 2021-07-22 NOTE — H&P (Addendum)
History and Physical    Rebecca Cannon TDD:[id number] DOB: 1929/08/29 DOA: 07/22/2021  PCP: Ramiro Harvest, PA-C  Patient coming from: [facility]  I have personally briefly reviewed patient's old medical records in [place]  Chief Complaint: Syncope, back pain  HPI: Rebecca Cannon is a 87 y.o. female with medical history significant for permanent atrial fibrillation on Xarelto, hypertension, hyperlipidemia, history of CVA who presented to the ED from her ALF for evaluation of back pain after a syncopal event 2 days prior.  Patient states that she currently resides at [facility].  On 11/10 she finished exercising.  She was walking back to her room with her walker.  She got to the end of the hallway, took a right turn, and then suddenly lost consciousness.  She did not have any preceding symptoms such as lightheadedness, dizziness, chest pain, palpitations, dyspnea.  She awoke on the ground with significant mid back pain.  She has had difficulty ambulating due to persistent pain.  She also states that she has not been keeping up with her fluid hydration recently.  Facility called her son to notify about her fall and pain today and he subsequently brought her to the ED for further evaluation.  ED Course:  Initial vitals showed BP 177/79, pulse 54, RR 22, temp 98.2 F, SPO2 96% on room air.  Labs show WBC 8.3, hemoglobin 14.0, platelets 286,000, sodium 135, potassium 3.7, bicarb 25, BUN 13, creatinine 1.01, serum glucose 141, high-sensitivity troponin 11 > 13.  SARS-CoV-2 and influenza PCR's are negative.  CT head without contrast is negative for acute intracranial abnormality, multiple old infarcts noted.  CT cervical spine without contrast is negative for acute cervical spine fracture or traumatic malalignment.  Advanced multilevel cervical spondylosis noted.  2 view chest x-ray is negative for focal consolidation, edema, effusion, or pneumothorax.  CT  thoracic and lumbar spine are negative for acute fracture.  Chronic moderate L4 compression deformity noted.  CT chest/abdomen/pelvis shows acute right lateral sixth and right posterior eighth rib fractures without pneumothorax.  Irregular 11 x 8 mm subpleural pulmonary nodule in the right lower lobe noted.  No acute intra-abdominal process seen.  Cholelithiasis and severe diffuse colonic diverticulosis without evidence of acute diverticulitis noted.  Patient was given Norco for pain and her home losartan 100 mg.  Orthostatic vitals were positive with BP 162/74 in the lying position and 114/80 in the sitting position.  The hospitalist service was consulted to admit for further evaluation and management.  Review of Systems: All systems reviewed and are negative except as documented in history of present illness above.   Past Medical History:  Diagnosis Date   Aphasia    Atherosclerosis of aorta (HCC)    Atrial fibrillation (HCC)    CVA (cerebral vascular accident) ([place])    CVD (cardiovascular disease)    DJD (degenerative joint disease)    GERD (gastroesophageal reflux disease)    Hyperlipidemia    Hypertension    Incontinence    Macular degeneration    Memory change    Osteoporosis    Stroke ([place]) 02/2014    Past Surgical History:  Procedure Laterality Date   TONSILLECTOMY     Varicose veins      Social History:  reports that she has never smoked. She has never used smokeless tobacco. She reports current alcohol use. She reports that she does not use drugs.  Allergies  Allergen Reactions   Amlodipine Other (See Comments)  Headaches and gagging.    Iodine Hives   Cefprozil     Other reaction(s): Other (See Comments)   Mirabegron     Other reaction(s): Other (See Comments)   Peppermint Flavor Other (See Comments)    Family History  Problem Relation Age of Onset   Stroke Father    Memory loss Neg Hx      Prior to Admission medications   Medication Sig Start  Date End Date Taking? Authorizing Provider  dorzolamide (TRUSOPT) 2 % ophthalmic solution 1 drop 3 (three) times daily. 04/24/21  Yes [provider]  furosemide (LASIX) 20 MG tablet Take 1 tablet (20 mg total) by mouth daily. 06/20/21 09/18/21 Yes Lelon Perla, MD  losartan (COZAAR) 100 MG tablet TAKE 1 TABLET BY MOUTH  DAILY 05/11/21  Yes Almyra Deforest, PA  omeprazole (PRILOSEC) 20 MG capsule Take 20 mg by mouth daily. 05/17/19  Yes [provider]  QUEtiapine (SEROQUEL) 25 MG tablet Take 25-50 mg by mouth at bedtime. 04/18/21  Yes [provider]  simvastatin (ZOCOR) 40 MG tablet Take 40 mg by mouth at bedtime. 05/17/19  Yes [provider]  timolol (TIMOPTIC) 0.5 % ophthalmic solution 1 drop 2 (two) times daily. 06/18/19  Yes [provider]  vitamin B-12 (CYANOCOBALAMIN) 100 MCG tablet Take 100 mcg by mouth daily.   Yes [provider]  vitamin E 1000 UNIT capsule Take 1,000 Units by mouth daily.   Yes [provider]  XARELTO 20 MG TABS tablet Take 1 tablet (20 mg total) by mouth daily with supper. 06/19/21  Yes Lelon Perla, MD  folic acid (FOLVITE) 301 MCG tablet 1 tablet    [provider]  Omega-3 Fatty Acids (FISH OIL) 1000 MG CAPS Take by mouth.    [provider]  oxybutynin (DITROPAN) 5 MG tablet  05/17/19   [provider]    Physical Exam: Vitals:   07/22/21 2016 07/22/21 2030 07/22/21 2045 07/22/21 2100  BP: (!) 192/78 (!) 159/77  (!) 159/97  Pulse: (!) 56 (!) 49 65 61  Resp: 16 (!) 25 (!) 25 (!) 22  Temp: 98.6 F (37 C)     TempSrc: Oral     SpO2: 96% 92% 95% 95%  Weight:      Height:       Constitutional: Elderly woman resting supine in bed, NAD, calm, comfortable except for intermittent right rib pain when moving Eyes: PERRL, lids and conjunctivae normal ENMT: Mucous membranes are dry. Posterior pharynx clear of any exudate or lesions.Normal dentition.  Neck: normal, supple, no  masses. Respiratory: clear to auscultation anteriorly.  Normal respiratory effort. No accessory muscle use.  Cardiovascular: Irregularly irregular m, no murmurs / rubs / gallops. No extremity edema. 2+ pedal pulses. Abdomen: no tenderness, no masses palpated. No hepatosplenomegaly. Bowel sounds positive.  Musculoskeletal: no clubbing / cyanosis. No joint deformity upper and lower extremities. Good ROM, no contractures. Normal muscle tone.  Skin: no rashes, lesions, ulcers. No induration Neurologic: CN 2-12 grossly intact. Sensation intact. Strength 5/5 in all 4.  Psychiatric: Normal judgment and insight. Alert and oriented x 3. Normal mood.   Labs on Admission: I have personally reviewed following labs and imaging studies  CBC: Recent Labs  Lab 07/22/21 1645  WBC 8.3  NEUTROABS 5.8  HGB 14.0  HCT 41.2  MCV 92.4  PLT 601   Basic Metabolic Panel: Recent Labs  Lab 07/22/21 1645  NA 135  K 3.7  CL 100  CO2 25  GLUCOSE 141*  BUN 13  CREATININE 1.01*  CALCIUM 9.1   GFR: Estimated Creatinine Clearance: 40.2 mL/min (A) (by C-G formula based on SCr of 1.01 mg/dL (H)). Liver Function Tests: No results for input(s): AST, ALT, ALKPHOS, BILITOT, PROT, ALBUMIN in the last 168 hours. No results for input(s): LIPASE, AMYLASE in the last 168 hours. No results for input(s): AMMONIA in the last 168 hours. Coagulation Profile: No results for input(s): INR, PROTIME in the last 168 hours. Cardiac Enzymes: No results for input(s): CKTOTAL, CKMB, CKMBINDEX, TROPONINI in the last 168 hours. BNP (last 3 results) No results for input(s): PROBNP in the last 8760 hours. HbA1C: No results for input(s): HGBA1C in the last 72 hours. CBG: No results for input(s): GLUCAP in the last 168 hours. Lipid Profile: No results for input(s): CHOL, HDL, LDLCALC, TRIG, CHOLHDL, LDLDIRECT in the last 72 hours. Thyroid Function Tests: No results for input(s): TSH, T4TOTAL, FREET4, T3FREE, THYROIDAB in the  last 72 hours. Anemia Panel: No results for input(s): VITAMINB12, FOLATE, FERRITIN, TIBC, IRON, RETICCTPCT in the last 72 hours. Urine analysis:    Component Value Date/Time   APPEARANCEUR Turbid (A) 01/13/2021 1328   GLUCOSEU Negative 01/13/2021 1328   BILIRUBINUR negative 02/05/2021 1607   BILIRUBINUR Negative 01/13/2021 1328   KETONESUR negative 02/05/2021 1607   PROTEINUR negative 02/05/2021 1607   PROTEINUR Trace 01/13/2021 1328   UROBILINOGEN 0.2 02/05/2021 1607   NITRITE Negative 02/05/2021 1607   NITRITE Positive (A) 01/13/2021 1328   LEUKOCYTESUR Trace (A) 02/05/2021 1607   LEUKOCYTESUR 2+ (A) 01/13/2021 1328    Radiological Exams on Admission: DG Chest 2 View  Result Date: 07/22/2021 CLINICAL DATA:  Fall. EXAM: CHEST - 2 VIEW COMPARISON:  None. FINDINGS: Patient is slightly rotated. Cardiomediastinal silhouette is within normal limits. There is minimal atelectasis in the left lung base. The lungs are otherwise clear. There is no pleural effusion or pneumothorax. The aorta is ectatic. No acute fractures are identified. IMPRESSION: No active cardiopulmonary disease. Electronically Signed   By: Ronney Asters M.D.   On: 07/22/2021 16:37   CT Head Wo Contrast  Result Date: 07/22/2021 CLINICAL DATA:  Recent fall. EXAM: CT HEAD WITHOUT CONTRAST CT CERVICAL SPINE WITHOUT CONTRAST TECHNIQUE: Multidetector CT imaging of the head and cervical spine was performed following the standard protocol without intravenous contrast. Multiplanar CT image reconstructions of the cervical spine were also generated. COMPARISON:  MRI brain dated March 04, 2021. FINDINGS: CT HEAD FINDINGS Brain: No evidence of acute infarction, hemorrhage, hydrocephalus, extra-axial collection or mass lesion/mass effect. 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 Old lacunar infarcts in the left and right cerebellum. Stable atrophy and chronic microvascular ischemic changes. Vascular: Calcified atherosclerosis at  the skull base. No hyperdense vessel. Skull: Negative for fracture or focal lesion. Sinuses/Orbits: No acute finding. Other: None. CT CERVICAL SPINE FINDINGS Alignment: No traumatic malalignment. Straightening of the normal cervical lordosis. Trace anterolisthesis at C2-C3 and C7-T1. Skull base and vertebrae: No acute fracture. No primary bone lesion or focal pathologic process. Soft tissues and spinal canal: No prevertebral fluid or swelling. No visible canal hematoma. Disc levels: Multilevel disc height loss, severe at C5-C6 and C6-C7. Diffuse severe facet arthropathy throughout the cervical spine. Prior ankylosis of the bilateral C3-C4 and C4-C5 facet joints. Upper chest: Negative. Other: None. IMPRESSION: 1. No acute intracranial abnormality.  Multiple old infarcts. 2. No acute cervical spine fracture or traumatic malalignment. Advanced multilevel cervical spondylosis. Electronically Signed   By: Huntley Dec  Derry M.D.   On: 07/22/2021 16:51   CT Cervical Spine Wo Contrast  Result Date: 07/22/2021 CLINICAL DATA:  Recent fall. EXAM: CT HEAD WITHOUT CONTRAST CT CERVICAL SPINE WITHOUT CONTRAST TECHNIQUE: Multidetector CT imaging of the head and cervical spine was performed following the standard protocol without intravenous contrast. Multiplanar CT image reconstructions of the cervical spine were also generated. COMPARISON:  MRI brain dated March 04, 2021. FINDINGS: CT HEAD FINDINGS Brain: No evidence of acute infarction, hemorrhage, hydrocephalus, extra-axial collection or mass lesion/mass effect. 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 Old lacunar infarcts in the left and right cerebellum. Stable atrophy and chronic microvascular ischemic changes. Vascular: Calcified atherosclerosis at the skull base. No hyperdense vessel. Skull: Negative for fracture or focal lesion. Sinuses/Orbits: No acute finding. Other: None. CT CERVICAL SPINE FINDINGS Alignment: No traumatic malalignment. Straightening of the  normal cervical lordosis. Trace anterolisthesis at C2-C3 and C7-T1. Skull base and vertebrae: No acute fracture. No primary bone lesion or focal pathologic process. Soft tissues and spinal canal: No prevertebral fluid or swelling. No visible canal hematoma. Disc levels: Multilevel disc height loss, severe at C5-C6 and C6-C7. Diffuse severe facet arthropathy throughout the cervical spine. Prior ankylosis of the bilateral C3-C4 and C4-C5 facet joints. Upper chest: Negative. Other: None. IMPRESSION: 1. No acute intracranial abnormality.  Multiple old infarcts. 2. No acute cervical spine fracture or traumatic malalignment. Advanced multilevel cervical spondylosis. Electronically Signed   By: Titus Dubin M.D.   On: 07/22/2021 16:51   CT T-SPINE NO CHARGE  Result Date: 07/22/2021 CLINICAL DATA:  Fall. EXAM: CT THORACIC AND LUMBAR SPINE WITHOUT CONTRAST TECHNIQUE: Multiplanar CT images of the thoracic and lumbar spine were reconstructed from contemporary CT of the Chest, Abdomen, and Pelvis CONTRAST:  None. COMPARISON:  None. FINDINGS: CT THORACIC SPINE FINDINGS Alignment: Mild dextroscoliosis. Trace stepwise anterolisthesis from T1-T2 through T3-T4. No traumatic malalignment. Vertebrae: No acute fracture or focal pathologic process. Paraspinal and other soft tissues: Please see separate CT chest, abdomen, and pelvis report from same day. Disc levels: Mild multilevel disc height loss, anterior endplate spurring, and facet arthropathy. CT LUMBAR SPINE FINDINGS Segmentation: 5 lumbar type vertebrae. Alignment: Trace anterolisthesis at L3-L4. 7 mm anterolisthesis at L4-L5. Vertebrae: No acute fracture or focal pathologic process. Chronic moderate L4 compression deformity. Paraspinal and other soft tissues: Please see separate CT chest, abdomen, and pelvis report from same day. Disc levels: Multilevel disc height loss, moderate to severe at L5-S1. Mild-to-moderate lumbar facet arthropathy. At least moderate spinal  canal stenosis at L4-L5. Moderate right neuroforaminal stenosis at L2-L3. IMPRESSION: 1. No acute fracture of the thoracic or lumbar spine. 2. Chronic moderate L4 compression deformity. Electronically Signed   By: Titus Dubin M.D.   On: 07/22/2021 18:06   CT L-SPINE NO CHARGE  Result Date: 07/22/2021 CLINICAL DATA:  Fall. EXAM: CT THORACIC AND LUMBAR SPINE WITHOUT CONTRAST TECHNIQUE: Multiplanar CT images of the thoracic and lumbar spine were reconstructed from contemporary CT of the Chest, Abdomen, and Pelvis CONTRAST:  None. COMPARISON:  None. FINDINGS: CT THORACIC SPINE FINDINGS Alignment: Mild dextroscoliosis. Trace stepwise anterolisthesis from T1-T2 through T3-T4. No traumatic malalignment. Vertebrae: No acute fracture or focal pathologic process. Paraspinal and other soft tissues: Please see separate CT chest, abdomen, and pelvis report from same day. Disc levels: Mild multilevel disc height loss, anterior endplate spurring, and facet arthropathy. CT LUMBAR SPINE FINDINGS Segmentation: 5 lumbar type vertebrae. Alignment: Trace anterolisthesis at L3-L4. 7 mm anterolisthesis at L4-L5. Vertebrae: No acute fracture or  focal pathologic process. Chronic moderate L4 compression deformity. Paraspinal and other soft tissues: Please see separate CT chest, abdomen, and pelvis report from same day. Disc levels: Multilevel disc height loss, moderate to severe at L5-S1. Mild-to-moderate lumbar facet arthropathy. At least moderate spinal canal stenosis at L4-L5. Moderate right neuroforaminal stenosis at L2-L3. IMPRESSION: 1. No acute fracture of the thoracic or lumbar spine. 2. Chronic moderate L4 compression deformity. Electronically Signed   By: Titus Dubin M.D.   On: 07/22/2021 18:06   CT CHEST ABDOMEN PELVIS WO CONTRAST  Result Date: 07/22/2021 CLINICAL DATA:  Rib pain after fall. EXAM: CT CHEST, ABDOMEN AND PELVIS WITHOUT CONTRAST TECHNIQUE: Multidetector CT imaging of the chest, abdomen and pelvis  was performed following the standard protocol without IV contrast. COMPARISON:  None. FINDINGS: CT CHEST FINDINGS Cardiovascular: Mild cardiomegaly with left atrial enlargement. No pericardial effusion. No thoracic aortic aneurysm. Coronary, aortic arch, and branch vessel atherosclerotic vascular disease. Mediastinum/Nodes: Mildly enlarged precarinal lymph node measuring 1.2 cm in short axis, likely reactive. No other enlarged mediastinal lymph nodes. No enlarged axillary lymph nodes. The thyroid gland, trachea, and esophagus demonstrate no significant findings. Lungs/Pleura: No focal consolidation, pleural effusion, or pneumothorax. Irregular 11 x 8 mm subpleural pulmonary nodule in the right lower lobe (series 4, image 94). Calcified left apical pleuroparenchymal scarring. Musculoskeletal: Acute nondisplaced fracture of the right lateral sixth rib. Acute minimally displaced fracture of right posterior eighth rib. CT ABDOMEN PELVIS FINDINGS Hepatobiliary: Multiple hepatic cysts measuring up to 2.5 cm. Two large gallstones. No gallbladder wall thickening or biliary dilatation. Pancreas: Unremarkable. No pancreatic ductal dilatation or surrounding inflammatory changes. Spleen: Normal in size without focal abnormality. Adrenals/Urinary Tract: Adrenal glands are unremarkable. Kidneys are normal, without renal calculi, focal lesion, or hydronephrosis. Bladder is unremarkable. Stomach/Bowel: Stomach is within normal limits. Diminutive, normal appendix. No evidence of bowel wall thickening, distention, or inflammatory changes. Severe diffuse colonic diverticulosis. Vascular/Lymphatic: Aortic atherosclerosis. No enlarged abdominal or pelvic lymph nodes. Reproductive: Status post hysterectomy. No adnexal masses. Other: Small fat containing right inguinal hernia. No free fluid or pneumoperitoneum. Musculoskeletal: No acute or significant osseous findings. Chronic L4 compression deformity. IMPRESSION: Chest: 1. Acute right  lateral sixth and right posterior eighth rib fractures. No pneumothorax. 2. Irregular 11 x 8 mm subpleural pulmonary nodule in the right lower lobe. Consider a non-contrast Chest CT at 3 months, a PET/CT, or tissue sampling. These guidelines do not apply to immunocompromised patients and patients with cancer. Follow up in patients with significant comorbidities as clinically warranted. For lung cancer screening, adhere to Lung-RADS guidelines. Reference: Radiology. 2017; 284(1):228-43. Abdomen and pelvis: 1. No acute intra-abdominal process. 2. Cholelithiasis. 3. Severe diffuse colonic diverticulosis without evidence of acute diverticulitis. 4. Aortic Atherosclerosis (ICD10-I70.0). Electronically Signed   By: Titus Dubin M.D.   On: 07/22/2021 17:59    EKG: Personally reviewed. Atrial fibrillation with PVC, rate 58.  Rate slower when compared to prior.  Assessment/Plan Principal Problem:   Syncope Active Problems:   Permanent atrial fibrillation (HCC)   Rib fractures   Hypertension   Hyperlipidemia   Nodule of lower lobe of right lung   Rebecca Cannon is a 87 y.o. female with medical history significant for permanent atrial fibrillation on Xarelto, hypertension, hyperlipidemia, history of CVA who is admitted for evaluation of syncope.  Syncope: Most likely orthostatic syncope as she did have significant drop in SBP from lying to sitting position.  She was markedly hypertensive initially in the ED although this is suspected due  to her rib fracture pain.  She does have permanent A. fib with intermittent bradycardia which could have contributed to her syncopal episode. -Started on IV fluids with LR@100  mL/hour overnight -Repeat orthostatic vitals in a.m. -Update echocardiogram -Keep on telemetry -Hold home losartan for now -Continue fall precautions, PT/OT eval  Permanent atrial fibrillation: Remains in atrial fibrillation with intermittent bradycardia.  She is not on any rate or rhythm  controlling medications.  Check magnesium.  Continue Xarelto.  Obtain echocardiogram as above.  Right lateral sixth and right posterior eighth rib fractures: Occurring after fall.  No evidence of pneumothorax on imaging.  Still having pain with limited movement. -Pain control with Tylenol, Oxy IR, IV morphine if needed with hold parameters -Start incentive spirometer, supplemental oxygen  Hypertension: Losartan on hold for now given orthostasis.  Elevated BP on ED arrival likely due to pain.  Hyperlipidemia: Continue simvastatin.  Right lower lobe lung nodule: Irregular 11 x 8 mm subpleural pulmonary nodule in the right lower lobe seen on CT chest.  Consider noncontrast chest CT at 3 months, PET/CT, or tissue sampling.  Findings discussed with patient and her son.  On further review of records, CT chest 12/26/2020 showed a 7 mm nodule in the right lower lobe.  Mild-moderate cognitive impairment: She has been on Seroquel for occasional irritable mood, hold for now.  DVT prophylaxis: Xarelto Code Status: DNR, confirmed with patient on admission Family Communication: Discussed with patient's son at bedside Disposition Plan: From [facility], dispo pending clinical progress Consults called: None Level of care: Telemetry Admission status:  Status is: Observation  The patient remains OBS appropriate and will d/c before 2 midnights.  Zada Finders MD [facility]  If 7PM-7AM, please contact night-coverage [email or web address]  07/22/2021, 10:25 PM

## 2021-07-22 NOTE — ED Triage Notes (Signed)
Pt from [facility] (brought in by family). C/o pain in ribs after reported fall on Thursday

## 2021-07-23 ENCOUNTER — Observation Stay (HOSPITAL_BASED_OUTPATIENT_CLINIC_OR_DEPARTMENT_OTHER): Payer: Medicare Other

## 2021-07-23 DIAGNOSIS — Z888 Allergy status to other drugs, medicaments and biological substances status: Secondary | ICD-10-CM | POA: Diagnosis not present

## 2021-07-23 DIAGNOSIS — W19XXXA Unspecified fall, initial encounter: Secondary | ICD-10-CM | POA: Diagnosis not present

## 2021-07-23 DIAGNOSIS — R911 Solitary pulmonary nodule: Secondary | ICD-10-CM | POA: Diagnosis present

## 2021-07-23 DIAGNOSIS — Z91048 Other nonmedicinal substance allergy status: Secondary | ICD-10-CM | POA: Diagnosis not present

## 2021-07-23 DIAGNOSIS — Y92009 Unspecified place in unspecified non-institutional (private) residence as the place of occurrence of the external cause: Secondary | ICD-10-CM | POA: Diagnosis not present

## 2021-07-23 DIAGNOSIS — D6869 Other thrombophilia: Secondary | ICD-10-CM | POA: Diagnosis not present

## 2021-07-23 DIAGNOSIS — Z20822 Contact with and (suspected) exposure to covid-19: Secondary | ICD-10-CM | POA: Diagnosis present

## 2021-07-23 DIAGNOSIS — R55 Syncope and collapse: Secondary | ICD-10-CM | POA: Diagnosis present

## 2021-07-23 DIAGNOSIS — Z823 Family history of stroke: Secondary | ICD-10-CM | POA: Diagnosis not present

## 2021-07-23 DIAGNOSIS — K219 Gastro-esophageal reflux disease without esophagitis: Secondary | ICD-10-CM | POA: Diagnosis present

## 2021-07-23 DIAGNOSIS — I700 Atherosclerosis of aorta: Secondary | ICD-10-CM | POA: Diagnosis present

## 2021-07-23 DIAGNOSIS — E785 Hyperlipidemia, unspecified: Secondary | ICD-10-CM | POA: Diagnosis present

## 2021-07-23 DIAGNOSIS — I4821 Permanent atrial fibrillation: Secondary | ICD-10-CM | POA: Diagnosis present

## 2021-07-23 DIAGNOSIS — R4189 Other symptoms and signs involving cognitive functions and awareness: Secondary | ICD-10-CM | POA: Diagnosis present

## 2021-07-23 DIAGNOSIS — R339 Retention of urine, unspecified: Secondary | ICD-10-CM | POA: Diagnosis present

## 2021-07-23 DIAGNOSIS — I2510 Atherosclerotic heart disease of native coronary artery without angina pectoris: Secondary | ICD-10-CM | POA: Diagnosis present

## 2021-07-23 DIAGNOSIS — M47812 Spondylosis without myelopathy or radiculopathy, cervical region: Secondary | ICD-10-CM | POA: Diagnosis present

## 2021-07-23 DIAGNOSIS — S2241XA Multiple fractures of ribs, right side, initial encounter for closed fracture: Secondary | ICD-10-CM

## 2021-07-23 DIAGNOSIS — Z66 Do not resuscitate: Secondary | ICD-10-CM | POA: Diagnosis present

## 2021-07-23 DIAGNOSIS — W1830XA Fall on same level, unspecified, initial encounter: Secondary | ICD-10-CM | POA: Diagnosis present

## 2021-07-23 DIAGNOSIS — H3530 Unspecified macular degeneration: Secondary | ICD-10-CM | POA: Diagnosis present

## 2021-07-23 DIAGNOSIS — D6859 Other primary thrombophilia: Secondary | ICD-10-CM | POA: Diagnosis present

## 2021-07-23 DIAGNOSIS — Z79899 Other long term (current) drug therapy: Secondary | ICD-10-CM | POA: Diagnosis not present

## 2021-07-23 DIAGNOSIS — Z7901 Long term (current) use of anticoagulants: Secondary | ICD-10-CM | POA: Diagnosis not present

## 2021-07-23 DIAGNOSIS — I10 Essential (primary) hypertension: Secondary | ICD-10-CM | POA: Diagnosis present

## 2021-07-23 DIAGNOSIS — Z9102 Food additives allergy status: Secondary | ICD-10-CM | POA: Diagnosis not present

## 2021-07-23 DIAGNOSIS — I4891 Unspecified atrial fibrillation: Secondary | ICD-10-CM

## 2021-07-23 DIAGNOSIS — Z8673 Personal history of transient ischemic attack (TIA), and cerebral infarction without residual deficits: Secondary | ICD-10-CM | POA: Diagnosis not present

## 2021-07-23 DIAGNOSIS — R001 Bradycardia, unspecified: Secondary | ICD-10-CM | POA: Diagnosis not present

## 2021-07-23 DIAGNOSIS — M810 Age-related osteoporosis without current pathological fracture: Secondary | ICD-10-CM | POA: Diagnosis present

## 2021-07-23 LAB — CBC
HCT: 39.4 % (ref 36.0–46.0)
Hemoglobin: 13.1 g/dL (ref 12.0–15.0)
MCH: 31.2 pg (ref 26.0–34.0)
MCHC: 33.2 g/dL (ref 30.0–36.0)
MCV: 93.8 fL (ref 80.0–100.0)
Platelets: 255 10*3/uL (ref 150–400)
RBC: 4.2 MIL/uL (ref 3.87–5.11)
RDW: 12.8 % (ref 11.5–15.5)
WBC: 8.8 10*3/uL (ref 4.0–10.5)
nRBC: 0 % (ref 0.0–0.2)

## 2021-07-23 LAB — ECHOCARDIOGRAM COMPLETE
AR max vel: 1.24 cm2
AV Area VTI: 1.43 cm2
AV Area mean vel: 1.35 cm2
AV Mean grad: 5 mmHg
AV Peak grad: 11.6 mmHg
Ao pk vel: 1.7 m/s
Area-P 1/2: 3.99 cm2
Height: 65 in
MV VTI: 1.51 cm2
S' Lateral: 3.2 cm
Weight: 3040 oz

## 2021-07-23 LAB — BASIC METABOLIC PANEL
Anion gap: 7 (ref 5–15)
BUN: 12 mg/dL (ref 8–23)
CO2: 29 mmol/L (ref 22–32)
Calcium: 8.7 mg/dL — ABNORMAL LOW (ref 8.9–10.3)
Chloride: 101 mmol/L (ref 98–111)
Creatinine, Ser: 0.83 mg/dL (ref 0.44–1.00)
GFR, Estimated: >60 mL/min (ref >60)
Glucose, Bld: 130 mg/dL — ABNORMAL HIGH (ref 70–99)
Potassium: 3.4 mmol/L — ABNORMAL LOW (ref 3.5–5.1)
Sodium: 137 mmol/L (ref 135–145)

## 2021-07-23 LAB — MRSA NEXT GEN BY PCR, NASAL: MRSA by PCR Next Gen: NOT DETECTED

## 2021-07-23 LAB — MAGNESIUM: Magnesium: 2 mg/dL (ref 1.7–2.4)

## 2021-07-23 LAB — GLUCOSE, CAPILLARY: Glucose-Capillary: 117 mg/dL — ABNORMAL HIGH (ref 70–99)

## 2021-07-23 MED ORDER — SERTRALINE HCL 50 MG PO TABS
75.0000 mg | ORAL_TABLET | Freq: Every morning | ORAL | Status: DC
  Administered 2021-07-24 – 2021-07-26 (×3): 75 mg via ORAL
  Filled 2021-07-23 (×3): qty 1

## 2021-07-23 MED ORDER — POTASSIUM CHLORIDE 10 MEQ/100ML IV SOLN
10.0000 meq | INTRAVENOUS | Status: AC
  Administered 2021-07-23 (×4): 10 meq via INTRAVENOUS
  Filled 2021-07-23 (×4): qty 100

## 2021-07-23 MED ORDER — ACETAMINOPHEN 500 MG PO TABS
1000.0000 mg | ORAL_TABLET | Freq: Four times a day (QID) | ORAL | Status: DC
  Administered 2021-07-23 – 2021-07-24 (×5): 1000 mg via ORAL
  Filled 2021-07-23 (×5): qty 2

## 2021-07-23 MED ORDER — LIDOCAINE 5 % EX PTCH
1.0000 | MEDICATED_PATCH | CUTANEOUS | Status: DC
  Administered 2021-07-23 – 2021-07-24 (×2): 1 via TRANSDERMAL
  Filled 2021-07-23 (×4): qty 1

## 2021-07-23 MED ORDER — HYDRALAZINE HCL 20 MG/ML IJ SOLN
5.0000 mg | Freq: Four times a day (QID) | INTRAMUSCULAR | Status: DC | PRN
  Administered 2021-07-24: 5 mg via INTRAVENOUS
  Filled 2021-07-23: qty 1

## 2021-07-23 MED ORDER — RIVAROXABAN 20 MG PO TABS
20.0000 mg | ORAL_TABLET | Freq: Every day | ORAL | Status: DC
  Administered 2021-07-23 – 2021-07-25 (×3): 20 mg via ORAL
  Filled 2021-07-23 (×3): qty 1

## 2021-07-23 NOTE — Progress Notes (Signed)
HR sustaining 37-44. Dr Roderic Palau notified. Will continue to monitor.

## 2021-07-23 NOTE — Progress Notes (Addendum)
PROGRESS NOTE    Rebecca Cannon  JXB:[id number] DOB: 07-Jan-1929 DOA: 07/22/2021 PCP: Jamie Kato    Brief Narrative:  87 year old female with a history of atrial fibrillation on anticoagulation, hypertension, hyperlipidemia who resides in assisted living facility.  She was walking with her walker when she had an unwitnessed fall.  Unclear if she had an episode of syncope.  On evaluation in the emergency room, she is noted to have right-sided rib fractures.  She is also noted to have bradycardia with slow atrial fibrillation.  She is not on any chronic AV nodal blocking agents.  Discussed with cardiology who felt to be appropriate to transfer to [facility] for EP evaluation.  Blood pressures currently stable.   Assessment & Plan:   Principal Problem:   Syncope Active Problems:   Permanent atrial fibrillation (HCC)   Rib fractures   Hypertension   Hyperlipidemia   Nodule of lower lobe of right lung   Fall with possible syncope -Patient had unwitnessed fall -She initially said that she may have passed out, but now she says she may have tripped and fell -She is noted to be bradycardic which can certainly be contributing to possible syncope -Echocardiogram has been ordered -Continue to monitor  Permanent atrial fibrillation with slow ventricular response -Heart rate noted to be 35 and she has been bradycardic overnight -Blood pressure currently elevated -She is not on any chronic AV nodal blockers -She is receiving timolol eyedrops which will be discontinued for now -She does not appear to be particularly symptomatic at this time -Reviewed case with Dr. Domenic Polite who felt that patient would benefit from being transferred to [facility] for EP evaluation to be considered for pacemaker -Continue on Xarelto  Right-sided rib fractures -Secondary to fall -No evidence of pneumothorax on chest x-ray -Treat supportively with pain management -We will use Tylenol, lidocaine  patch  Hyperlipidemia -Continue statin  Right lower lobe lung nodule -This can be followed as an outpatient with repeat imaging of the chest in 3 months if patient/family desires to do so -CT results were discussed with patient and her son by Dr. Posey Pronto  Mild to moderate cognitive impairment -Recently evaluated by neurology with recommendations to start low-dose Namenda -Patient was unwilling to start at that time  Generalized weakness -Patient currently resides in assisted living and ambulates with a walker -Will need PT/OT evaluation  Hypertension -Blood pressure likely elevated in light of pain from rib fractures -Outpatient dose of losartan was initially held -We will use as needed hydralazine for now  Urinary retention -noted to have 850cc via in and out catheterization -will continue I and O cath for 24 hours before considering foley catheter   DVT prophylaxis:  rivaroxaban (XARELTO) tablet 20 mg  Code Status: DNR Family Communication: Updated patient's son Disposition Plan: Status is: Observation  The patient remains OBS appropriate and will d/c before 2 midnights.        Consultants:  Cardiology  Procedures:    Antimicrobials:      Subjective: Patient denies any dizziness or lightheadedness.  She is not having any chest pressure.  She does have pain in the right side of her ribs and associated shortness of breath with deep breathing.  She does not feel as though she is going to pass out.  She did recently received pain medicine and feels a little groggy from that.  Objective: Vitals:   07/22/21 2045 07/22/21 2100 07/23/21 0217 07/23/21 0706  BP:  (!) 159/97 (!) 172/62 Marland Kitchen)  182/88  Pulse: 65 61 (!) 50 (!) 41  Resp: (!) 25 (!) 22 14 18   Temp:   98.9 F (37.2 C) 98.5 F (36.9 C)  TempSrc:      SpO2: 95% 95% 97% 98%  Weight:      Height:        Intake/Output Summary (Last 24 hours) at 07/23/2021 1248 Last data filed at 07/23/2021 0900 Gross per  24 hour  Intake 349.43 ml  Output 1050 ml  Net -700.57 ml   Filed Weights   07/22/21 1544  Weight: 86.2 kg    Examination:  General exam: Appears calm and comfortable  Respiratory system: Clear to auscultation. Respiratory effort normal. Cardiovascular system: S1 & S2 heard, RRR. No JVD, murmurs, rubs, gallops or clicks. No pedal edema. Gastrointestinal system: Abdomen is nondistended, soft and nontender. No organomegaly or masses felt. Normal bowel sounds heard. Central nervous system: Alert and oriented. No focal neurological deficits. Extremities: Symmetric 5 x 5 power. Skin: No rashes, lesions or ulcers Psychiatry: Judgement and insight appear normal. Mood & affect appropriate.     Data Reviewed: I have personally reviewed following labs and imaging studies  CBC: Recent Labs  Lab 07/22/21 1645 07/23/21 0135  WBC 8.3 8.8  NEUTROABS 5.8  --   HGB 14.0 13.1  HCT 41.2 39.4  MCV 92.4 93.8  PLT 286 163   Basic Metabolic Panel: Recent Labs  Lab 07/22/21 1645 07/23/21 0135  NA 135 137  K 3.7 3.4*  CL 100 101  CO2 25 29  GLUCOSE 141* 130*  BUN 13 12  CREATININE 1.01* 0.83  CALCIUM 9.1 8.7*  MG  --  2.0   GFR: Estimated Creatinine Clearance: 48.9 mL/min (by C-G formula based on SCr of 0.83 mg/dL). Liver Function Tests: No results for input(s): AST, ALT, ALKPHOS, BILITOT, PROT, ALBUMIN in the last 168 hours. No results for input(s): LIPASE, AMYLASE in the last 168 hours. No results for input(s): AMMONIA in the last 168 hours. Coagulation Profile: No results for input(s): INR, PROTIME in the last 168 hours. Cardiac Enzymes: No results for input(s): CKTOTAL, CKMB, CKMBINDEX, TROPONINI in the last 168 hours. BNP (last 3 results) No results for input(s): PROBNP in the last 8760 hours. HbA1C: No results for input(s): HGBA1C in the last 72 hours. CBG: Recent Labs  Lab 07/23/21 0934  GLUCAP 117*   Lipid Profile: No results for input(s): CHOL, HDL, LDLCALC,  TRIG, CHOLHDL, LDLDIRECT in the last 72 hours. Thyroid Function Tests: No results for input(s): TSH, T4TOTAL, FREET4, T3FREE, THYROIDAB in the last 72 hours. Anemia Panel: No results for input(s): VITAMINB12, FOLATE, FERRITIN, TIBC, IRON, RETICCTPCT in the last 72 hours. Sepsis Labs: No results for input(s): PROCALCITON, LATICACIDVEN in the last 168 hours.  Recent Results (from the past 240 hour(s))  Resp Panel by RT-PCR (Flu A&B, Covid) Nasopharyngeal Swab     Status: None   Collection Time: 07/22/21  7:55 PM   Specimen: Nasopharyngeal Swab; Nasopharyngeal(NP) swabs in vial transport medium  Result Value Ref Range Status   SARS Coronavirus 2 by RT PCR NEGATIVE NEGATIVE Final    Comment: (NOTE) SARS-CoV-2 target nucleic acids are NOT DETECTED.  The SARS-CoV-2 RNA is generally detectable in upper respiratory specimens during the acute phase of infection. The lowest concentration of SARS-CoV-2 viral copies this assay can detect is 138 copies/mL. A negative result does not preclude SARS-Cov-2 infection and should not be used as the sole basis for treatment or other patient management decisions.  A negative result may occur with  improper specimen collection/handling, submission of specimen other than nasopharyngeal swab, presence of viral mutation(s) within the areas targeted by this assay, and inadequate number of viral copies(<138 copies/mL). A negative result must be combined with clinical observations, patient history, and epidemiological information. The expected result is Negative.  Fact Sheet for Patients:  EntrepreneurPulse.com.au  Fact Sheet for Healthcare Providers:  IncredibleEmployment.be  This test is no t yet approved or cleared by the [place] FDA and  has been authorized for detection and/or diagnosis of SARS-CoV-2 by FDA under an Emergency Use Authorization (EUA). This EUA will remain  in effect (meaning this test can be  used) for the duration of the COVID-19 declaration under Section 564(b)(1) of the Act, 21 U.S.C.section 360bbb-3(b)(1), unless the authorization is terminated  or revoked sooner.       Influenza A by PCR NEGATIVE NEGATIVE Final   Influenza B by PCR NEGATIVE NEGATIVE Final    Comment: (NOTE) The Xpert Xpress SARS-CoV-2/FLU/RSV plus assay is intended as an aid in the diagnosis of influenza from Nasopharyngeal swab specimens and should not be used as a sole basis for treatment. Nasal washings and aspirates are unacceptable for Xpert Xpress SARS-CoV-2/FLU/RSV testing.  Fact Sheet for Patients: EntrepreneurPulse.com.au  Fact Sheet for Healthcare Providers: IncredibleEmployment.be  This test is not yet approved or cleared by the [place] FDA and has been authorized for detection and/or diagnosis of SARS-CoV-2 by FDA under an Emergency Use Authorization (EUA). This EUA will remain in effect (meaning this test can be used) for the duration of the COVID-19 declaration under Section 564(b)(1) of the Act, 21 U.S.C. section 360bbb-3(b)(1), unless the authorization is terminated or revoked.  Performed at [facility], [address] [place], [place] [address]   MRSA Next Gen by PCR, Nasal     Status: None   Collection Time: 07/23/21  3:19 AM   Specimen: Nasal Mucosa; Nasal Swab  Result Value Ref Range Status   MRSA by PCR Next Gen NOT DETECTED NOT DETECTED Final    Comment: (NOTE) The GeneXpert MRSA Assay (FDA approved for NASAL specimens only), is one component of a comprehensive MRSA colonization surveillance program. It is not intended to diagnose MRSA infection nor to guide or monitor treatment for MRSA infections. Test performance is not FDA approved in patients less than 47 years old. Performed at [facility], [place] [address] [place], [place] [address]          Radiology Studies: DG Chest 2  View  Result Date: 07/22/2021 CLINICAL DATA:  Fall. EXAM: CHEST - 2 VIEW COMPARISON:  None. FINDINGS: Patient is slightly rotated. Cardiomediastinal silhouette is within normal limits. There is minimal atelectasis in the left lung base. The lungs are otherwise clear. There is no pleural effusion or pneumothorax. The aorta is ectatic. No acute fractures are identified. IMPRESSION: No active cardiopulmonary disease. Electronically Signed   By: Ronney Asters M.D.   On: 07/22/2021 16:37   CT Head Wo Contrast  Result Date: 07/22/2021 CLINICAL DATA:  Recent fall. EXAM: CT HEAD WITHOUT CONTRAST CT CERVICAL SPINE WITHOUT CONTRAST TECHNIQUE: Multidetector CT imaging of the head and cervical spine was performed following the standard protocol without intravenous contrast. Multiplanar CT image reconstructions of the cervical spine were also generated. COMPARISON:  MRI brain dated March 04, 2021. FINDINGS: CT HEAD FINDINGS Brain: No evidence of acute infarction, hemorrhage, hydrocephalus, extra-axial collection or mass lesion/mass effect. 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 Old lacunar infarcts in the left and right cerebellum. Stable atrophy and chronic microvascular ischemic changes. Vascular: Calcified atherosclerosis at the skull base. No hyperdense vessel. Skull: Negative for fracture or focal lesion. Sinuses/Orbits: No acute finding. Other: None. CT CERVICAL SPINE FINDINGS Alignment: No traumatic malalignment. Straightening of the normal cervical lordosis. Trace anterolisthesis at C2-C3 and C7-T1. Skull base and vertebrae: No acute fracture. No primary bone lesion or focal pathologic process. Soft tissues and spinal canal: No prevertebral fluid or swelling. No visible canal hematoma. Disc levels: Multilevel disc height loss, severe at C5-C6 and C6-C7. Diffuse severe facet arthropathy throughout the cervical spine. Prior ankylosis of the bilateral C3-C4 and C4-C5 facet joints. Upper chest: Negative.  Other: None. IMPRESSION: 1. No acute intracranial abnormality.  Multiple old infarcts. 2. No acute cervical spine fracture or traumatic malalignment. Advanced multilevel cervical spondylosis. Electronically Signed   By: Titus Dubin M.D.   On: 07/22/2021 16:51   CT Cervical Spine Wo Contrast  Result Date: 07/22/2021 CLINICAL DATA:  Recent fall. EXAM: CT HEAD WITHOUT CONTRAST CT CERVICAL SPINE WITHOUT CONTRAST TECHNIQUE: Multidetector CT imaging of the head and cervical spine was performed following the standard protocol without intravenous contrast. Multiplanar CT image reconstructions of the cervical spine were also generated. COMPARISON:  MRI brain dated March 04, 2021. FINDINGS: CT HEAD FINDINGS Brain: No evidence of acute infarction, hemorrhage, hydrocephalus, extra-axial collection or mass lesion/mass effect. 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 Old lacunar infarcts in the left and right cerebellum. Stable atrophy and chronic microvascular ischemic changes. Vascular: Calcified atherosclerosis at the skull base. No hyperdense vessel. Skull: Negative for fracture or focal lesion. Sinuses/Orbits: No acute finding. Other: None. CT CERVICAL SPINE FINDINGS Alignment: No traumatic malalignment. Straightening of the normal cervical lordosis. Trace anterolisthesis at C2-C3 and C7-T1. Skull base and vertebrae: No acute fracture. No primary bone lesion or focal pathologic process. Soft tissues and spinal canal: No prevertebral fluid or swelling. No visible canal hematoma. Disc levels: Multilevel disc height loss, severe at C5-C6 and C6-C7. Diffuse severe facet arthropathy throughout the cervical spine. Prior ankylosis of the bilateral C3-C4 and C4-C5 facet joints. Upper chest: Negative. Other: None. IMPRESSION: 1. No acute intracranial abnormality.  Multiple old infarcts. 2. No acute cervical spine fracture or traumatic malalignment. Advanced multilevel cervical spondylosis. Electronically Signed    By: Titus Dubin M.D.   On: 07/22/2021 16:51   CT T-SPINE NO CHARGE  Result Date: 07/22/2021 CLINICAL DATA:  Fall. EXAM: CT THORACIC AND LUMBAR SPINE WITHOUT CONTRAST TECHNIQUE: Multiplanar CT images of the thoracic and lumbar spine were reconstructed from contemporary CT of the Chest, Abdomen, and Pelvis CONTRAST:  None. COMPARISON:  None. FINDINGS: CT THORACIC SPINE FINDINGS Alignment: Mild dextroscoliosis. Trace stepwise anterolisthesis from T1-T2 through T3-T4. No traumatic malalignment. Vertebrae: No acute fracture or focal pathologic process. Paraspinal and other soft tissues: Please see separate CT chest, abdomen, and pelvis report from same day. Disc levels: Mild multilevel disc height loss, anterior endplate spurring, and facet arthropathy. CT LUMBAR SPINE FINDINGS Segmentation: 5 lumbar type vertebrae. Alignment: Trace anterolisthesis at L3-L4. 7 mm anterolisthesis at L4-L5. Vertebrae: No acute fracture or focal pathologic process. Chronic moderate L4 compression deformity. Paraspinal and other soft tissues: Please see separate CT chest, abdomen, and pelvis report from same day. Disc levels: Multilevel disc height loss, moderate to severe at L5-S1. Mild-to-moderate lumbar facet arthropathy. At least moderate spinal canal stenosis at L4-L5. Moderate right neuroforaminal stenosis at L2-L3. IMPRESSION: 1. No acute fracture of  the thoracic or lumbar spine. 2. Chronic moderate L4 compression deformity. Electronically Signed   By: Titus Dubin M.D.   On: 07/22/2021 18:06   CT L-SPINE NO CHARGE  Result Date: 07/22/2021 CLINICAL DATA:  Fall. EXAM: CT THORACIC AND LUMBAR SPINE WITHOUT CONTRAST TECHNIQUE: Multiplanar CT images of the thoracic and lumbar spine were reconstructed from contemporary CT of the Chest, Abdomen, and Pelvis CONTRAST:  None. COMPARISON:  None. FINDINGS: CT THORACIC SPINE FINDINGS Alignment: Mild dextroscoliosis. Trace stepwise anterolisthesis from T1-T2 through T3-T4. No  traumatic malalignment. Vertebrae: No acute fracture or focal pathologic process. Paraspinal and other soft tissues: Please see separate CT chest, abdomen, and pelvis report from same day. Disc levels: Mild multilevel disc height loss, anterior endplate spurring, and facet arthropathy. CT LUMBAR SPINE FINDINGS Segmentation: 5 lumbar type vertebrae. Alignment: Trace anterolisthesis at L3-L4. 7 mm anterolisthesis at L4-L5. Vertebrae: No acute fracture or focal pathologic process. Chronic moderate L4 compression deformity. Paraspinal and other soft tissues: Please see separate CT chest, abdomen, and pelvis report from same day. Disc levels: Multilevel disc height loss, moderate to severe at L5-S1. Mild-to-moderate lumbar facet arthropathy. At least moderate spinal canal stenosis at L4-L5. Moderate right neuroforaminal stenosis at L2-L3. IMPRESSION: 1. No acute fracture of the thoracic or lumbar spine. 2. Chronic moderate L4 compression deformity. Electronically Signed   By: Titus Dubin M.D.   On: 07/22/2021 18:06   CT CHEST ABDOMEN PELVIS WO CONTRAST  Result Date: 07/22/2021 CLINICAL DATA:  Rib pain after fall. EXAM: CT CHEST, ABDOMEN AND PELVIS WITHOUT CONTRAST TECHNIQUE: Multidetector CT imaging of the chest, abdomen and pelvis was performed following the standard protocol without IV contrast. COMPARISON:  None. FINDINGS: CT CHEST FINDINGS Cardiovascular: Mild cardiomegaly with left atrial enlargement. No pericardial effusion. No thoracic aortic aneurysm. Coronary, aortic arch, and branch vessel atherosclerotic vascular disease. Mediastinum/Nodes: Mildly enlarged precarinal lymph node measuring 1.2 cm in short axis, likely reactive. No other enlarged mediastinal lymph nodes. No enlarged axillary lymph nodes. The thyroid gland, trachea, and esophagus demonstrate no significant findings. Lungs/Pleura: No focal consolidation, pleural effusion, or pneumothorax. Irregular 11 x 8 mm subpleural pulmonary nodule in  the right lower lobe (series 4, image 94). Calcified left apical pleuroparenchymal scarring. Musculoskeletal: Acute nondisplaced fracture of the right lateral sixth rib. Acute minimally displaced fracture of right posterior eighth rib. CT ABDOMEN PELVIS FINDINGS Hepatobiliary: Multiple hepatic cysts measuring up to 2.5 cm. Two large gallstones. No gallbladder wall thickening or biliary dilatation. Pancreas: Unremarkable. No pancreatic ductal dilatation or surrounding inflammatory changes. Spleen: Normal in size without focal abnormality. Adrenals/Urinary Tract: Adrenal glands are unremarkable. Kidneys are normal, without renal calculi, focal lesion, or hydronephrosis. Bladder is unremarkable. Stomach/Bowel: Stomach is within normal limits. Diminutive, normal appendix. No evidence of bowel wall thickening, distention, or inflammatory changes. Severe diffuse colonic diverticulosis. Vascular/Lymphatic: Aortic atherosclerosis. No enlarged abdominal or pelvic lymph nodes. Reproductive: Status post hysterectomy. No adnexal masses. Other: Small fat containing right inguinal hernia. No free fluid or pneumoperitoneum. Musculoskeletal: No acute or significant osseous findings. Chronic L4 compression deformity. IMPRESSION: Chest: 1. Acute right lateral sixth and right posterior eighth rib fractures. No pneumothorax. 2. Irregular 11 x 8 mm subpleural pulmonary nodule in the right lower lobe. Consider a non-contrast Chest CT at 3 months, a PET/CT, or tissue sampling. These guidelines do not apply to immunocompromised patients and patients with cancer. Follow up in patients with significant comorbidities as clinically warranted. For lung cancer screening, adhere to Lung-RADS guidelines. Reference: Radiology. 2017; 284(1):228-43. Abdomen  and pelvis: 1. No acute intra-abdominal process. 2. Cholelithiasis. 3. Severe diffuse colonic diverticulosis without evidence of acute diverticulitis. 4. Aortic Atherosclerosis (ICD10-I70.0).  Electronically Signed   By: Titus Dubin M.D.   On: 07/22/2021 17:59         Scheduled Meds:  Rivaroxaban  20 mg Oral Q supper   simvastatin  40 mg Oral QHS   sodium chloride flush  3 mL Intravenous Q12H   Continuous Infusions:  potassium chloride       LOS: 0 days    Time spent: 41mins    Kathie Dike, MD [facility]   If 7PM-7AM, please contact night-coverage [email or web address]  07/23/2021, 12:48 PM

## 2021-07-23 NOTE — Progress Notes (Signed)
OT Cancellation Note  Patient Details Name: Rebecca Cannon MRN: [id number] DOB: 27-Jul-1929   Cancelled Treatment:    Reason Eval/Treat Not Completed: Medical issues which prohibited therapy Patient's HR is in 65s nursing is asking to hold off at this time. OT will check back tomorrow.  Jackelyn Poling OTR/L, [facility] Office# [phone] Pager# [phone]    07/23/2021, 12:37 PM

## 2021-07-23 NOTE — Progress Notes (Signed)
PT Cancellation Note  Patient Details Name: Rebecca Cannon MRN: [id number] DOB: October 22, 1928   Cancelled Treatment:    Reason Eval/Treat Not Completed: Medical issues which prohibited therapy pt with bradycardia.  Will check back as schedule permits.   Kati L Payson 07/23/2021, 12:57 PM Jannette Spanner PT, DPT Acute Rehabilitation Services Pager: [phone] Office: [phone]

## 2021-07-23 NOTE — Progress Notes (Signed)
Patient received from [facility] via EMS.  Patient is alert, oriented x 2.  Assisted in bed in position of comfort.  No skin breakdown noted at this time.  2L [place] in place.  Oriented to room and unit routine, needs addressed.

## 2021-07-23 NOTE — Progress Notes (Signed)
After speaking to cardiology earlier in the day with recommendations to transfer patient to [facility] for EP evaluation, I made several attempts to call back Mr. Roets to update him on plan, but was unable to get through to him. I left several voicemails explaining that she would need to be transferred to [facility] for EP evaluation and that I would try and reach out again. Later in the evening, I called Mr. Rena back to update him on the plan and that patient had been moved to [facility] for further evaluation. Mr. Turan appeared to be upset that patient was moved to [facility] and did not feel that this was necessary at this time. I apologized for any miscommunication, but my impression after our conversation was that he was agreeable to pacemaker evaluation and that it would need to be done at [facility]. I also explained that patient's heart rate was sustaining at 35 beats/min, so expediting the evaluation appeared necessary. Mr. Hine still seemed to be upset that she was moved to [facility]. I again apologized for any miscommunication and tried to answer his questions to the best of my abilities.  Kathie Dike MD

## 2021-07-24 ENCOUNTER — Encounter (HOSPITAL_COMMUNITY): Payer: Self-pay | Admitting: Internal Medicine

## 2021-07-24 ENCOUNTER — Telehealth: Payer: Self-pay | Admitting: Cardiology

## 2021-07-24 DIAGNOSIS — W19XXXA Unspecified fall, initial encounter: Secondary | ICD-10-CM

## 2021-07-24 DIAGNOSIS — R001 Bradycardia, unspecified: Secondary | ICD-10-CM

## 2021-07-24 DIAGNOSIS — I4891 Unspecified atrial fibrillation: Secondary | ICD-10-CM

## 2021-07-24 DIAGNOSIS — Y92009 Unspecified place in unspecified non-institutional (private) residence as the place of occurrence of the external cause: Secondary | ICD-10-CM

## 2021-07-24 DIAGNOSIS — R911 Solitary pulmonary nodule: Secondary | ICD-10-CM

## 2021-07-24 DIAGNOSIS — D6869 Other thrombophilia: Secondary | ICD-10-CM

## 2021-07-24 DIAGNOSIS — S2241XA Multiple fractures of ribs, right side, initial encounter for closed fracture: Secondary | ICD-10-CM | POA: Diagnosis not present

## 2021-07-24 DIAGNOSIS — I700 Atherosclerosis of aorta: Secondary | ICD-10-CM

## 2021-07-24 DIAGNOSIS — K5790 Diverticulosis of intestine, part unspecified, without perforation or abscess without bleeding: Secondary | ICD-10-CM

## 2021-07-24 HISTORY — DX: Diverticulosis of intestine, part unspecified, without perforation or abscess without bleeding: K57.90

## 2021-07-24 LAB — GLUCOSE, CAPILLARY: Glucose-Capillary: 112 mg/dL — ABNORMAL HIGH (ref 70–99)

## 2021-07-24 MED ORDER — ACETAMINOPHEN 325 MG PO TABS
650.0000 mg | ORAL_TABLET | Freq: Four times a day (QID) | ORAL | Status: DC
  Administered 2021-07-24 – 2021-07-26 (×7): 650 mg via ORAL
  Filled 2021-07-24 (×8): qty 2

## 2021-07-24 MED ORDER — HYDROCODONE-ACETAMINOPHEN 5-325 MG PO TABS
1.0000 | ORAL_TABLET | Freq: Four times a day (QID) | ORAL | Status: DC | PRN
  Administered 2021-07-24: 1 via ORAL
  Filled 2021-07-24 (×2): qty 1

## 2021-07-24 MED ORDER — OXYCODONE HCL 5 MG PO TABS
5.0000 mg | ORAL_TABLET | Freq: Four times a day (QID) | ORAL | Status: DC | PRN
  Administered 2021-07-24 – 2021-07-25 (×2): 5 mg via ORAL
  Filled 2021-07-24 (×2): qty 1

## 2021-07-24 MED ORDER — FUROSEMIDE 20 MG PO TABS
20.0000 mg | ORAL_TABLET | Freq: Every day | ORAL | Status: DC
  Administered 2021-07-24 – 2021-07-26 (×3): 20 mg via ORAL
  Filled 2021-07-24 (×3): qty 1

## 2021-07-24 MED ORDER — LOSARTAN POTASSIUM 50 MG PO TABS
100.0000 mg | ORAL_TABLET | Freq: Every day | ORAL | Status: DC
  Administered 2021-07-24 – 2021-07-26 (×3): 100 mg via ORAL
  Filled 2021-07-24 (×3): qty 2

## 2021-07-24 MED ORDER — LATANOPROST 0.005 % OP SOLN
1.0000 [drp] | Freq: Every day | OPHTHALMIC | Status: DC
  Administered 2021-07-24 – 2021-07-25 (×2): 1 [drp] via OPHTHALMIC
  Filled 2021-07-24: qty 2.5

## 2021-07-24 NOTE — Assessment & Plan Note (Signed)
--   Son reports has been high in the outpatient setting, difficult to assess presently with rib fractures and pain -- Continue losartan, Lasix.  Son reports patient could not tolerate amlodipine. -- Follow-up as an outpatient

## 2021-07-24 NOTE — Progress Notes (Signed)
Progress Note Rebecca Cannon   RJJ:[id number]  DOB: 02/13/1929  DOA: 07/22/2021     1 Date of Service: 07/24/2021   Clinical Course 87 year old woman lives at [facility], [facility] including atrial fibrillation on rivaroxaban, presented to the emergency department at [facility] after a fall.  Initial concern was for possible symptomatic bradycardia given slow ventricular response, atrial fibrillation, decision was made to transfer [facility] to [facility] for electrophysiology evaluation.   --11/12 admit to [facility] for fall, possible symptomatic bradycardia, multiple rib fractures w/ HR 30-40s in ED --11/13 transfer to [facility] for EP eval --11/14 subsequent interview with patient, information conflicts without initially obtained, patient reporting a mechanical fall secondary to chairs in the hallway.  EP evaluation in progress.  Has some pain in ribs on the right secondary to fractures.  Not on oxygen at home, on minimal oxygen here.  Assessment and Plan * Fall at home, initial encounter -- Etiology unclear, there was initial concern for cardiogenic possibility, son reports initially the patient had no memory of the event, today the patient is adamant she did not lose consciousness and that this was a mechanical fall. --EP evaluation in progress, cannot entirely exclude symptomatic bradycardia but this seems less likely than mechanical fall at this time. -- PT evaluation to assess for patient's capacity to return directly to assisted living facility  Atrial fibrillation with slow ventricular response (HCC) -- Chronic in nature, no recent falls per son, no obvious significant abnormalities during this hospitalization -- Has been on atenolol for some time, other medications include Ditropan -- Recommend stopping Ditropan, follow-up with cardiology as an outpatient -- Obviously rate control agents contraindicated.  Continue Xarelto.  Discussed risk/benefit with son, he wants to continue this at this time. --  Echocardiogram normal LVEF with normal function and no wall motion abnormalities  Rib fractures -- CT showed 2 rib fractures on the right side without complicating features, no pneumothorax. -- Pain control, incentive spirometry, mobility -- No clear oxygen need documented, wean off oxygen if possible, PT evaluation  Hyperlipidemia -- Stable, continue statin  Acquired thrombophilia (HCC) --secondary to afib, continue rivaroxaban   Nodule of lower lobe of right lung -- irregular 11 x 8 mm subpleural pulmonary nodule in the right lower lobe. Consider a non-contrast Chest CT at 3 months, a PET/CT, or tissue sampling.  -- Outpatient follow-up recommended, refer to pulmonary nodule clinic.  Benign essential HTN -- Son reports has been high in the outpatient setting, difficult to assess presently with rib fractures and pain -- Continue losartan, Lasix.  Son reports patient could not tolerate amlodipine. -- Follow-up as an outpatient  Aortic atherosclerosis ([place]) --continue statin   Await PT evaluation, further input from cardiology, plan to wean from oxygen.  If does well with PT, could likely return to ALF today if cleared by cardiology.  Subjective:  Feels ok today, has some right-sided rib pain Breathing ok Reports fall in hallway, got tripped by equipment Denies syncope/LOC  Objective Vitals:   07/24/21 0337 07/24/21 0549 07/24/21 0741 07/24/21 0800  BP: 114/72  (!) 208/93 (!) 187/71  Pulse: (!) 53   62  Resp: 18   (!) 26  Temp: 98 F (36.7 C)     TempSrc: Axillary     SpO2: 97%   95%  Weight:  79.6 kg    Height:       79.6 kg  Vital signs were reviewed and unremarkable except for: bradycardia Telemetry afib, bradycardia  Exam Physical Exam  Constitutional:      General: She is not in acute distress.    Appearance: She is not ill-appearing or toxic-appearing.  Cardiovascular:     Rate and Rhythm: Bradycardia present. Rhythm irregular.     Heart sounds: No  murmur heard. Pulmonary:     Effort: Pulmonary effort is normal. No respiratory distress.     Breath sounds: Normal breath sounds. No wheezing, rhonchi or rales.  Musculoskeletal:     Right lower leg: No edema.     Left lower leg: No edema.  Neurological:     Mental Status: She is alert.  Psychiatric:        Mood and Affect: Mood normal.        Behavior: Behavior normal.    Labs / Other Information There are no new results to review at this time. Labs from yesterday, this admission and echocardiogram noted  Disposition Plan: Status is: Inpatient  Remains inpatient appropriate because: At the present moment awaiting EP full evaluation, therapy evaluation to assess for stability to return to ALF.  Xarelto In-depth discussion with son Marcello Moores by telephone   Time spent: 50 minutes, detailed interview with patient, discussion with son by telephone, conferred with ophthalmology office Dr. Vangie Bicker in regard to ophthalmic medications, messaging with electrophysiology and cardiology.  [facility] 07/24/2021, 11:03 AM

## 2021-07-24 NOTE — Progress Notes (Signed)
MD notified of elevated BPs, IV hydralazine given.  Also notified that pt is having hiccups causing severe increased pain to right rib fractures.  He will be around to see pt soon and address new orders at that time.

## 2021-07-24 NOTE — Assessment & Plan Note (Addendum)
--   CT showed 2 rib fractures on the right side without complicating features, no pneumothorax. -- Continue pain control, incentive spirometry, mobility -- No clear oxygen need documented, wean off oxygen if possible

## 2021-07-24 NOTE — Telephone Encounter (Signed)
Spoke with pt son, all of his questions were answered to his satisfaction.

## 2021-07-24 NOTE — Evaluation (Signed)
Occupational Therapy Evaluation Patient Details Name: Rebecca Cannon MRN: [id number] DOB: June 21, 1929 Today's Date: 07/24/2021   History of Present Illness Pt is a 87 year old woman admitted to [facility] after falling at her ALF 2 days prior to admission on 07/22/21. Fall resulted in R side rib fxs x 2. LLL nodule incidentally found. Pt noted to have bradycardia, transferred to [facility] 07/23/21 for possible pacemaker. PMH: afib, HTN, HLD, CVA, mild cognitive impairment, macular degeneration, osteoporosis, arthritis.   Clinical Impression   Pt walked with a rollator and was independent in self care. ALF assisted her with IADL. Pt presents with pain, increased anxiety and baseline cognitive impairment. Sp02 was primarily >93% throughout session, decreased briefly to 88% when breath holding, cues for deep breathing. She requires min to mod assist for mobility with second person for safety lines. She requires min to total assist for ADL. Pt will need increased assistance at her ALF. Son is exploring this option.      Recommendations for follow up therapy are one component of a multi-disciplinary discharge planning process, led by the attending physician.  Recommendations may be updated based on patient status, additional functional criteria and insurance authorization.   Follow Up Recommendations  Home health OT (home health aid)    Assistance Recommended at Discharge Frequent or constant Supervision/Assistance  Functional Status Assessment  Patient has had a recent decline in their functional status and demonstrates the ability to make significant improvements in function in a reasonable and predictable amount of time.  Equipment Recommendations  BSC/3in1;Hospital bed    Recommendations for Other Services       Precautions / Restrictions Precautions Precautions: Fall      Mobility Bed Mobility Overal bed mobility: Needs Assistance Bed Mobility: Supine to Sit     Supine to sit: HOB  elevated;Mod assist     General bed mobility comments: assist to raise trunk and position hips at EOB    Transfers Overall transfer level: Needs assistance Equipment used: Rolling walker (2 wheels) Transfers: Sit to/from Stand Sit to Stand: +2 physical assistance;Min assist;Mod assist           General transfer comment: +2 min assist using bed pad under hips to stand from bed, mod assist from [facility] with second person for safety and lines      Balance Overall balance assessment: Needs assistance   Sitting balance-Leahy Scale: Fair       Standing balance-Leahy Scale: Poor Standing balance comment: reliant on at least one hand support in static standing, 2 hands to take steps                           ADL either performed or assessed with clinical judgement   ADL Overall ADL's : Needs assistance/impaired Eating/Feeding: Minimal assistance;Sitting Eating/Feeding Details (indicate cue type and reason): assist to open containers Grooming: Set up;Sitting   Upper Body Bathing: Moderate assistance;Sitting   Lower Body Bathing: Total assistance;Sit to/from stand   Upper Body Dressing : Minimal assistance;Sitting   Lower Body Dressing: Total assistance;Sit to/from stand   Toilet Transfer: Minimal assistance;+2 for safety/equipment;Rolling walker (2 wheels);BSC/3in1   Toileting- Clothing Manipulation and Hygiene: Total assistance;Sit to/from stand Toileting - Clothing Manipulation Details (indicate cue type and reason): pt with urinary incontinence     Functional mobility during ADLs: Minimal assistance;+2 for safety/equipment;Rolling walker (2 wheels) (very slow) General ADL Comments: pain limiting     Vision Baseline Vision/History: 6 Macular Degeneration;1 Wears glasses  Ability to See in Adequate Light: 0 Adequate Patient Visual Report: No change from baseline       Perception     Praxis      Pertinent Vitals/Pain Pain Assessment: Faces Faces Pain  Scale: Hurts whole lot Pain Location: R ribs Pain Descriptors / Indicators: Grimacing;Guarding;Moaning Pain Intervention(s): Monitored during session;Repositioned;Other (comment) (splinting with pillow)     Hand Dominance Right   Extremity/Trunk Assessment Upper Extremity Assessment Upper Extremity Assessment: RUE deficits/detail;LUE deficits/detail RUE Deficits / Details: arthritic changes in hand, limited use due to rib pain RUE Coordination: decreased fine motor;decreased gross motor LUE Deficits / Details: arthritic changes in hand   Lower Extremity Assessment Lower Extremity Assessment: Defer to PT evaluation   Cervical / Trunk Assessment Cervical / Trunk Assessment: Other exceptions (rib fractures)   Communication Communication Communication: HOH;Other (comment) (with hearing aids)   Cognition Arousal/Alertness: Awake/alert Behavior During Therapy: Anxious Overall Cognitive Status: History of cognitive impairments - at baseline                                 General Comments: pt repeating how she feels ill informed about what was going on with her since being hospitalized, poor insight into skills needed to return to [facility] Comments       Exercises     Shoulder Instructions      Home Living Family/patient expects to be discharged to:: Assisted living   Available Help at Discharge: Personal care attendant;Available 24 hours/day Type of Home: Assisted living Home Access: Level entry     Home Layout: One level     Bathroom Shower/Tub: Occupational psychologist: Handicapped height     Home Equipment: Rollator (4 wheels);Shower seat;Grab bars - toilet;Grab bars - tub/shower;Hand held shower head          Prior Functioning/Environment Prior Level of Function : Independent/Modified Independent             Mobility Comments: walks with rollator ADLs Comments: assisted only for meds, meals and housekeeping         OT Problem List: Decreased strength;Decreased activity tolerance;Impaired balance (sitting and/or standing);Decreased coordination;Decreased cognition;Decreased knowledge of use of DME or AE;Pain;Impaired UE functional use      OT Treatment/Interventions: Self-care/ADL training;DME and/or AE instruction;Therapeutic activities;Patient/family education;Balance training    OT Goals(Current goals can be found in the care plan section) Acute Rehab OT Goals Patient Stated Goal: pain relief OT Goal Formulation: With patient Time For Goal Achievement: 08/07/21 Potential to Achieve Goals: Fair ADL Goals Pt Will Perform Grooming: standing;with min guard assist Pt Will Perform Upper Body Bathing: with min assist;sitting Pt Will Perform Upper Body Dressing: sitting;with supervision Pt Will Transfer to Toilet: with min guard assist;ambulating;bedside commode Pt Will Perform Toileting - Clothing Manipulation and hygiene: with min assist;sit to/from stand Additional ADL Goal #1: Pt will perform bed mobility with supervision using bed rail.  OT Frequency: Min 2X/week   Barriers to D/C:            Co-evaluation PT/OT/SLP Co-Evaluation/Treatment: Yes Reason for Co-Treatment: For patient/therapist safety   OT goals addressed during session: ADL's and self-care      AM-PAC OT "6 Clicks" Daily Activity     Outcome Measure Help from another person eating meals?: A Little Help from another person taking care of personal grooming?: A Little Help from another person toileting, which includes using toliet,  bedpan, or urinal?: Total Help from another person bathing (including washing, rinsing, drying)?: A Lot Help from another person to put on and taking off regular upper body clothing?: A Little Help from another person to put on and taking off regular lower body clothing?: Total 6 Click Score: 13   End of Session Equipment Utilized During Treatment: Rolling walker (2 wheels)  Activity  Tolerance: Patient limited by pain Patient left: in chair;with call bell/phone within reach;with chair alarm set (RT in room)  OT Visit Diagnosis: Unsteadiness on feet (R26.81);Other abnormalities of gait and mobility (R26.89);Pain;Other symptoms and signs involving cognitive function;Muscle weakness (generalized) (M62.81)                Time: 1700-1749 OT Time Calculation (min): 56 min Charges:  OT General Charges $OT Visit: 1 Visit OT Evaluation $OT Eval Moderate Complexity: 1 Mod OT Treatments $Self Care/Home Management : 8-22 mins  Nestor Lewandowsky, OTR/L Acute Rehabilitation Services Pager: [phone] Office: [phone]   Malka So 07/24/2021, 1:26 PM

## 2021-07-24 NOTE — Assessment & Plan Note (Signed)
Stable, continue statin 

## 2021-07-24 NOTE — Assessment & Plan Note (Signed)
--  secondary to afib, continue rivaroxaban

## 2021-07-24 NOTE — Telephone Encounter (Signed)
Returned call to son (ok per DPR)-states he would like to talk to Dr. [name], reports mother has been in the hospital since Saturday and it has been a "cluster".   She fell last Thursday, he did not find out until Saturday.  He took her to ER at [facility] was transferred to [facility].   She then was transferred again to [facility] to monitor her heart.  States he has a lot of questions and would like to discuss with Dr. [name].    Advised Dr. [name] in office-not rounding this week but per chart review MD should be coming to see patient today (per M. Tillery PA note) to discuss and answer any questions he has.    Son aware and verbalized understanding but would like to speak with Dr. [name] if able.     Routed to MD to make aware.

## 2021-07-24 NOTE — Assessment & Plan Note (Addendum)
--   Chronic in nature, no recent falls per son, no obvious significant abnormalities during this hospitalization -- Has been on timolol for some time, given fall was mechanical in nature, recommend continuing at this time. Other medications include Ditropan which recommend discontinuing. -- Continue Xarelto.  Discussed risk/benefit with son, he wants to continue  -- Echocardiogram normal LVEF with normal function and no wall motion abnormalities

## 2021-07-24 NOTE — Hospital Course (Addendum)
87 year old woman lives at [facility] [facility], [facility] including atrial fibrillation on rivaroxaban, presented to the emergency department at [facility] after a fall.  Initial concern was for possible symptomatic bradycardia given slow ventricular response, atrial fibrillation, decision was made to transfer patient to [facility] for [facility]iology evaluation.   --11/12 admit to [facility] for fall, possible symptomatic bradycardia, multiple rib fractures w/ HR 30-40s in ED --11/13 transfer to [facility] for EP eval --11/14 subsequent interview with patient, information conflicts without initially obtained, patient reporting a mechanical fall secondary to chairs in the hallway.  EP evaluation recommended watchful waiting.  Has some pain in ribs on the right secondary to fractures.  Not on oxygen at home, on minimal oxygen here. --11/15 patiently medically stable, was assessed by PT with recommendation for SNF prior to return to ALF (CSW confirmed ALF could not receive back at this time)

## 2021-07-24 NOTE — Assessment & Plan Note (Signed)
--   irregular 11 x 8 mm subpleural pulmonary nodule in the right lower lobe. Consider a non-contrast Chest CT at 3 months, a PET/CT, or tissue sampling.  -- Outpatient follow-up recommended, refer to pulmonary nodule clinic.

## 2021-07-24 NOTE — Telephone Encounter (Signed)
[phone] Son is returning call to Dr. [name]

## 2021-07-24 NOTE — TOC CAGE-AID Note (Signed)
Transition of Care [facility]) - CAGE-AID Screening   Patient Details  Name: Rebecca Cannon MRN: [id number] Date of Birth: [birth year]  Transition of Care [facility]) CM/SW Contact:    Midori Dado C Tarpley-Carter, [place] Phone Number: 07/24/2021, 11:10 AM   Clinical Narrative: Pt is unable to participate in Cage Aid.  Pt is not appropriate for assessment.  Lyna Laningham Tarpley-Carter, MSW, LCSW-A Pronouns:  She/Her/Hers [facility] HealthTransitions of Care Clinical Social Worker Direct Number:  [phone] [email or web address]   CAGE-AID Screening: Substance Abuse Screening unable to be completed due to: : Patient unable to participate (Pt is not appropriate for assessment.)             Substance Abuse Education Offered: No

## 2021-07-24 NOTE — Assessment & Plan Note (Signed)
continue statin

## 2021-07-24 NOTE — Telephone Encounter (Signed)
New Messsage:     Patient's son would like to have Dr [name] to give him a call as soon as he have the time. He said patient went to [place] and now she is in [place]. Patient says he would like for Dr [name] to handle this for him. He wil give him l the details when he calls.

## 2021-07-24 NOTE — Progress Notes (Signed)
Pt's son, Sherise Geerdes, came by and expressed his wish to contact Ms. Staff's cardiologist before proceeding to the pacemaker evaluation.

## 2021-07-24 NOTE — NC FL2 (Signed)
[place] MEDICAID FL2 LEVEL OF CARE SCREENING TOOL     IDENTIFICATION  Patient Name: Rebecca Cannon Birthdate: Jun 12, 1929 Sex: female Admission Date (Current Location): 07/22/2021  [facility], [facility], [facility] and [place] Number:  Herbalist and Address:  [facility] [place] [place] [address], [place], [place] [address]      Provider Number: [id number]  Attending Physician Name and Address:  Samuella Cota, MD  Relative Name and Phone Number:  aileana, hodder   [phone]    Current Level of Care: Hospital Recommended Level of Care: [place] [facility] Prior Approval Number:    Date Approved/Denied:   PASRR Number:    Discharge Plan: Other (Comment) (Assisted living)    Current Diagnoses: Patient Active Problem List   Diagnosis Date Noted   Aortic atherosclerosis ([place]) 07/24/2021   Diverticulosis 07/24/2021   Fall at home, initial encounter 07/24/2021   Atrial fibrillation with slow ventricular response ([place]) 07/24/2021   Acquired thrombophilia ([place]) 07/24/2021   Syncope 07/22/2021   Permanent atrial fibrillation ([place]) 07/22/2021   Rib fractures 07/22/2021   Benign essential HTN    Hyperlipidemia    Nodule of lower lobe of right lung     Orientation RESPIRATION BLADDER Height & Weight     Self, Time, Situation, Place  O2 External catheter Weight: 175 lb 7.8 oz (79.6 kg) Height:  5\' 5"  (165.1 cm)  BEHAVIORAL SYMPTOMS/MOOD NEUROLOGICAL BOWEL NUTRITION STATUS      Continent Diet (see discharge summary)  AMBULATORY STATUS COMMUNICATION OF NEEDS Skin   Limited Assist Verbally Other (Comment) (ecchymosis)                       Personal Care Assistance Level of Assistance  Bathing, Feeding, Dressing Bathing Assistance: Maximum assistance Feeding assistance: Limited assistance Dressing Assistance: Maximum assistance     Functional Limitations Info  Sight, Hearing, Speech Sight Info: Adequate Hearing Info: Adequate Speech Info: Adequate     SPECIAL CARE FACTORS FREQUENCY  PT (By licensed PT), OT (By licensed OT)     PT Frequency: 5x week OT Frequency: 5x week            Contractures Contractures Info: Not present    Additional Factors Info  Code Status, Allergies Code Status Info: DNR Allergies Info: Amlodipine, Iodine, Cefprozil, Mirabegron, Peppermint Flavor           Current Medications (07/24/2021):  This is the current hospital active medication list Current Facility-Administered Medications  Medication Dose Route Frequency Provider Last Rate Last Admin   acetaminophen (TYLENOL) tablet 1,000 mg  1,000 mg Oral Q6H Memon, Jehanzeb, MD   1,000 mg at 07/24/21 1034   furosemide (LASIX) tablet 20 mg  20 mg Oral Daily Samuella Cota, MD   20 mg at 07/24/21 1034   hydrALAZINE (APRESOLINE) injection 5 mg  5 mg Intravenous Q6H PRN Kathie Dike, MD   5 mg at 07/24/21 0741   HYDROcodone-acetaminophen (NORCO/VICODIN) 5-325 MG per tablet 1 tablet  1 tablet Oral Q6H PRN Samuella Cota, MD   1 tablet at 07/24/21 1119   latanoprost (XALATAN) 0.005 % ophthalmic solution 1 drop  1 drop Both Eyes QHS Samuella Cota, MD       lidocaine (LIDODERM) 5 % 1 patch  1 patch Transdermal Q24H Kathie Dike, MD   1 patch at 07/23/21 1523   losartan (COZAAR) tablet 100 mg  100 mg Oral Daily Samuella Cota, MD   100 mg  at 07/24/21 1035   ondansetron (ZOFRAN) tablet 4 mg  4 mg Oral Q6H PRN Lenore Cordia, MD       Or   ondansetron (ZOFRAN) injection 4 mg  4 mg Intravenous Q6H PRN Lenore Cordia, MD       rivaroxaban Alveda Reasons) tablet 20 mg  20 mg Oral Q supper Thomes Lolling, RPH   20 mg at 07/23/21 1825   senna-docusate (Senokot-S) tablet 1 tablet  1 tablet Oral QHS PRN Lenore Cordia, MD       sertraline (ZOLOFT) tablet 75 mg  75 mg Oral q AM Kristopher Oppenheim, DO   75 mg at 07/24/21 7948   simvastatin (ZOCOR) tablet 40 mg  40 mg Oral QHS Regan Lemming, MD   40 mg at 07/23/21 2202   sodium chloride flush (NS) 0.9  % injection 3 mL  3 mL Intravenous Q12H Lenore Cordia, MD   3 mL at 07/22/21 2320     Discharge Medications: Please see discharge summary for a list of discharge medications.  Relevant Imaging Results:  Relevant Lab Results:   Additional Information    Joanne Chars, LCSW

## 2021-07-24 NOTE — Assessment & Plan Note (Addendum)
--   Most likely mechanical fall in nature.  There was initial concern for cardiogenic possibility, son reports initially the patient had no memory of the event, but the patient is adamant she did not lose consciousness and that this was a mechanical fall. --EP evaluation appreciated, watchful waiting recommended, no rate control agents, continue anticoagulation, no indication for pacemaker at this time. -- PT recommended SNF, CSW discussed with ALF which could not accept patient.  Plan for SNF.

## 2021-07-24 NOTE — TOC Initial Note (Addendum)
Transition of Care [facility]) - Initial/Assessment Note    Patient Details  Name: Rebecca Cannon MRN: [id number] Date of Birth: October 09, 1928  [facility]) CM/SW Contact:    Joanne Chars, LCSW Phone Number: 07/24/2021, 3:25 PM  Clinical Narrative:     CSW met with pt and son [name] to discuss discharge recommendation for SNF.  Pt very irritable immediately upon CSW entering the room, does give permission for CSW to speak with son, [name].  Pt is from [facility] on [address]   [name] has been in touch with Benjamine Mola RN there and they said they can meet her needs for return without SNF.    CSW spoke with Roderic Palau [phone]) RN at [facility], discussed pt current level of functioning.  He requested PT/OT notes and FL2 be faxed to [phone].  They will review and respond if they can take pt back directly.  Attempted to fax documents, did not go through. Tried twice.  Unable to get ahold of anyone at [facility] to get another fax number.  Will continue to try.     CSW updated son [name], discussed SNF if [facility] cannot take her back at this time, provided SNF choice document.             Expected Discharge Plan: Assisted Living Barriers to Discharge: Other (must enter comment) (pending acceptance back to [facility])   Patient Goals and CMS Choice   CMS Medicare.gov Compare Post Acute Care list provided to:: Patient Represenative (must comment) (SNF choice document given to son as back up plan) Choice offered to / list presented to : Adult Children  Expected Discharge Plan and Services Expected Discharge Plan: Assisted Living In-house Referral: Clinical Social Work   Post Acute Care Choice: Home Health (at [facility]) Living arrangements for the past 2 months: [place]                                      Prior Living Arrangements/Services Living arrangements for the past 2 months: [place] Lives with:: Facility  Resident Patient language and need for interpreter reviewed:: Yes        Need for Family Participation in Patient Care: Yes (Comment) Care giver support system in place?: Yes (comment) Current home services: Other (comment) (na) Criminal Activity/Legal Involvement Pertinent to Current Situation/Hospitalization: No - Comment as needed  Activities of Daily Living Home Assistive Devices/Equipment: Eyeglasses, Gilford Rile (specify type) ADL Screening (condition at time of admission) Patient's cognitive ability adequate to safely complete daily activities?: Yes Is the patient deaf or have difficulty hearing?: Yes Does the patient have difficulty seeing, even when wearing glasses/contacts?: Yes Does the patient have difficulty concentrating, remembering, or making decisions?: No Patient able to express need for assistance with ADLs?: Yes Does the patient have difficulty dressing or bathing?: Yes Independently performs ADLs?: Yes (appropriate for developmental age) Does the patient have difficulty walking or climbing stairs?: Yes Weakness of Legs: Both Weakness of Arms/Hands: None  Permission Sought/Granted Permission sought to share information with : Family Supports Permission granted to share information with : Yes, Verbal Permission Granted  Share Information with NAME: son Tom           Emotional Assessment Appearance:: Appears stated age Attitude/Demeanor/Rapport: Other (comment) (iritable) Affect (typically observed): Irritable Orientation: : Oriented to Self, Oriented to Place, Oriented to  Time, Oriented to Situation Alcohol / Substance  Use: Not Applicable Psych Involvement: No (comment)  Admission diagnosis:  Syncope [R55] Fall [W19.XXXA] Atrial fibrillation with slow ventricular response ([place]) [I48.91] Fall, initial encounter [W19.XXXA] Closed fracture of multiple ribs of right side, initial encounter [S22.41XA] Syncope and collapse [R55] Patient Active Problem List    Diagnosis Date Noted   Aortic atherosclerosis ([place]) 07/24/2021   Diverticulosis 07/24/2021   Fall at home, initial encounter 07/24/2021   Atrial fibrillation with slow ventricular response ([place]) 07/24/2021   Acquired thrombophilia ([place]) 07/24/2021   Syncope 07/22/2021   Permanent atrial fibrillation ([place]) 07/22/2021   Rib fractures 07/22/2021   Benign essential HTN    Hyperlipidemia    Nodule of lower lobe of right lung    PCP:  Ramiro Harvest, PA-C Pharmacy:   [place], [address] [place] [address] Phone: [phone] Fax: [phone]     Social Determinants of Health (SDOH) Interventions    Readmission Risk Interventions No flowsheet data found.

## 2021-07-24 NOTE — Evaluation (Signed)
Physical Therapy Evaluation Patient Details Name: Rebecca Cannon MRN: [id number] DOB: 06-04-1929 Today's Date: 07/24/2021  History of Present Illness  Pt is a 87 year old woman admitted to [facility] after falling at her ALF 2 days prior to admission on 07/22/21. Fall resulted in R side rib fxs x 2. LLL nodule incidentally found. Pt noted to have bradycardia, transferred to [facility] 07/23/21 for possible pacemaker. PMH: afib, HTN, HLD, CVA, mild cognitive impairment, macular degeneration, osteoporosis, arthritis.   Clinical Impression  Pt in bed upon arrival of PT, agreeable to evaluation after discussion with pt and her son. Prior to admission the pt was mobilizing with use of rollator at her ALF, but reports she was independent with all ADLs and mobility in her apartment. The pt now presents with limitations in functional mobility, strength, stability, and activity tolerance due to above dx and resulting pain,  and will continue to benefit from skilled PT to address these deficits. The pt required minA and increased time/cues to complete mobility to EOB, as well as modA to complete scooting to position at EOB. The pt was able to complete sit-stand transfers with modA of 1-2, and required increased time, cues, and assist due to level of pain at this time. The pt will need increased assist for a short time to complete all mobility and transfers due to impact of pain on mobility, will continue to benefit from skilled PT acutely as well as after d/c to maximize functional recovery and independence.      Recommendations for follow up therapy are one component of a multi-disciplinary discharge planning process, led by the attending physician.  Recommendations may be updated based on patient status, additional functional criteria and insurance authorization.  Follow Up Recommendations Skilled nursing-short term rehab (<3 hours/day)    Assistance Recommended at Discharge Frequent or constant Supervision/Assistance   Functional Status Assessment Patient has had a recent decline in their functional status and demonstrates the ability to make significant improvements in function in a reasonable and predictable amount of time.  Equipment Recommendations  Hospital bed;BSC/3in1    Recommendations for Other Services       Precautions / Restrictions Precautions Precautions: Fall Restrictions Weight Bearing Restrictions: No      Mobility  Bed Mobility Overal bed mobility: Needs Assistance Bed Mobility: Supine to Sit     Supine to sit: HOB elevated;Mod assist     General bed mobility comments: assist to raise trunk and position hips at EOB    Transfers Overall transfer level: Needs assistance Equipment used: Rolling walker (2 wheels) Transfers: Sit to/from Stand Sit to Stand: +2 physical assistance;Min assist;Mod assist           General transfer comment: +2 min assist using bed pad under hips to stand from bed, mod assist from [facility] with second person for safety and lines    Ambulation/Gait Ambulation/Gait assistance: Min assist;+2 safety/equipment (chair follow) Gait Distance (Feet): 8 Feet Assistive device: Rolling walker (2 wheels) Gait Pattern/deviations: Step-to pattern;Decreased stride length;Knee hyperextension - left Gait velocity: decreased Gait velocity interpretation: <1.31 ft/sec, indicative of household ambulator   General Gait Details: pt with small, slow steps with increased time static standing after each step. the pt required some assist to advance RW as well as close chair follow due to poor endurance     Balance Overall balance assessment: Needs assistance   Sitting balance-Leahy Scale: Fair       Standing balance-Leahy Scale: Poor Standing balance comment: reliant on at least one  hand support in static standing, 2 hands to take steps                             Pertinent Vitals/Pain Pain Assessment: Faces Faces Pain Scale: Hurts whole  lot Pain Location: R ribs Pain Descriptors / Indicators: Grimacing;Guarding;Moaning Pain Intervention(s): Monitored during session;Premedicated before session;Other (comment) (splint with pillow)    Home Living Family/patient expects to be discharged to:: Assisted living   Available Help at Discharge: Personal care attendant;Available 24 hours/day Type of Home: Assisted living Home Access: Level entry       Home Layout: One level Home Equipment: Rollator (4 wheels);Shower seat;Grab bars - toilet;Grab bars - tub/shower;Hand held shower head      Prior Function Prior Level of Function : Independent/Modified Independent             Mobility Comments: walks with rollator ADLs Comments: assisted only for meds, meals and housekeeping     Hand Dominance   Dominant Hand: Right    Extremity/Trunk Assessment   Upper Extremity Assessment Upper Extremity Assessment: RUE deficits/detail;LUE deficits/detail RUE Deficits / Details: arthritic changes in hand, limited use due to rib pain RUE Coordination: decreased fine motor;decreased gross motor LUE Deficits / Details: arthritic changes in hand    Lower Extremity Assessment Lower Extremity Assessment: Generalized weakness (pt able to move BLE against gravity with good ROM. noted hyperextension in L knee wiht gait as well as bilateral external rotation)    Cervical / Trunk Assessment Cervical / Trunk Assessment: Other exceptions (rib fractures)  Communication   Communication: HOH;Other (comment) (with hearing aids)  Cognition Arousal/Alertness: Awake/alert Behavior During Therapy: Anxious Overall Cognitive Status: History of cognitive impairments - at baseline                                 General Comments: pt repeating how she feels ill informed about what was going on with her since being hospitalized, poor insight into skills needed to return to [place] comments (skin  integrity, edema, etc.): VSS on RA    Exercises     Assessment/Plan    PT Assessment Patient needs continued PT services  PT Problem List Decreased strength;Decreased range of motion;Decreased activity tolerance;Decreased balance;Decreased mobility;Decreased coordination;Decreased knowledge of precautions;Decreased cognition;Pain       PT Treatment Interventions DME instruction;Gait training;Functional mobility training;Stair training;Therapeutic activities;Therapeutic exercise;Balance training;Patient/family education    PT Goals (Current goals can be found in the Care Plan section)  Acute Rehab PT Goals Patient Stated Goal: to reduce pain PT Goal Formulation: With patient Time For Goal Achievement: 08/07/21 Potential to Achieve Goals: Good    Frequency Min 2X/week   Barriers to discharge Decreased caregiver support      Co-evaluation PT/OT/SLP Co-Evaluation/Treatment: Yes Reason for Co-Treatment: For patient/therapist safety;To address functional/ADL transfers PT goals addressed during session: Mobility/safety with mobility;Balance;Proper use of DME;Strengthening/ROM OT goals addressed during session: ADL's and self-care       AM-PAC PT "6 Clicks" Mobility  Outcome Measure Help needed turning from your back to your side while in a flat bed without using bedrails?: A Little Help needed moving from lying on your back to sitting on the side of a flat bed without using bedrails?: A Little Help needed moving to and from a bed to a chair (including a wheelchair)?: A Lot Help  needed standing up from a chair using your arms (e.g., wheelchair or bedside chair)?: A Lot Help needed to walk in hospital room?: A Little Help needed climbing 3-5 steps with a railing? : A Lot 6 Click Score: 15    End of Session Equipment Utilized During Treatment: Gait belt Activity Tolerance: Patient tolerated treatment well;Patient limited by pain Patient left: in chair;with call bell/phone within  reach;with chair alarm set Nurse Communication: Mobility status PT Visit Diagnosis: Other abnormalities of gait and mobility (R26.89);Muscle weakness (generalized) (M62.81);Pain Pain - Right/Left: Right Pain - part of body:  (ribs)    Time: 8833-7445 PT Time Calculation (min) (ACUTE ONLY): 54 min   Charges:   PT Evaluation $PT Eval Moderate Complexity: 1 Mod PT Treatments $Gait Training: 8-22 mins        West Carbo, PT, DPT   Acute Rehabilitation Department Pager #: [phone]  Sandra Cockayne 07/24/2021, 1:42 PM

## 2021-07-24 NOTE — Consult Note (Addendum)
ELECTROPHYSIOLOGY CONSULT NOTE    Patient ID: Rebecca Cannon MRN: [id number], DOB/AGE: 10-22-1928 87 y.o.  Admit date: 07/22/2021 Date of Consult: 07/24/2021  Primary Physician: Ramiro Harvest, PA-C Primary Cardiologist: Kirk Ruths, MD  Electrophysiologist:  New  Referring Provider: Dr. Sarajane Jews  Patient Profile: Rebecca Cannon is a 88 y.o. female with a history of permanent AF on Xarelto, HTN, HLD, h/o CVA who presented to the ED from ALF for evaluation of back pain after a syncopal event last week who is being seen today for the evaluation of bradycardia at the request of Dr. Sarajane Jews.  HPI:  Rebecca Cannon is a 87 y.o. female with medical history as above.   Patient states that she currently resides at [place].  On 11/10 she finished exercising.  She was walking back to her room with her walker.  She got to the end of the hallway, took a right turn, and per notes, had syncope. Per patient, she is adamant that she clipped her wheel on a bed or chair that was around the corner. She remembers falling and hitting the ground, and denies any LOC.  She did not have any preceding symptoms such as lightheadedness, dizziness, chest pain, palpitations, dyspnea.  She awoke on the ground with significant mid back pain.  She has had difficulty ambulating due to persistent pain.  She also states that she has not been keeping up with her fluid hydration recently.  Facility called her son to notify about her fall and pain 07/22/21 and he subsequently brought her to the ED for further evaluation.  Pertinent labs on admission include: WBC 8.3, hemoglobin 14.0, platelets 286,000, sodium 135, potassium 3.7, bicarb 25, BUN 13, creatinine 1.01, serum glucose 141, high-sensitivity troponin 11 > 13.   SARS-CoV-2 and influenza PCR's are negative.   CT head without contrast is negative for acute intracranial abnormality, multiple old infarcts noted.   CT cervical spine without contrast is  negative for acute cervical spine fracture or traumatic malalignment.  Advanced multilevel cervical spondylosis noted.   2 view chest x-ray is negative for focal consolidation, edema, effusion, or pneumothorax.   CT thoracic and lumbar spine are negative for acute fracture.  Chronic moderate L4 compression deformity noted.  CT chest/abdomen/pelvis shows acute right lateral sixth and right posterior eighth rib fractures without pneumothorax.  Irregular 11 x 8 mm subpleural pulmonary nodule in the right lower lobe noted.  No acute intra-abdominal process seen.  Cholelithiasis and severe diffuse colonic diverticulosis without evidence of acute diverticulitis noted.  The patient is doing OK currently, but remains in significant discomfort from rib fractures. She is frustrated because she feels like very little has been communicated to her. She did not understand why she was transported from [place] to [facility], and was upset her son wasn't told prior to. She has had slow HRs for years she reports, and wore a monitor for Dr. [name] in 09/2019. The patient denies any history of syncope.   Holter 09/2019 Showed AF with PVCs vs aberrantly conducted beats with HRs as low as 32 nocturnally.   Past Medical History:  Diagnosis Date   Aphasia    Atherosclerosis of aorta (HCC)    Atrial fibrillation (HCC)    CVA (cerebral vascular accident) ([place])    CVD (cardiovascular disease)    DJD (degenerative joint disease)    GERD (gastroesophageal reflux disease)    Hyperlipidemia    Hypertension    Incontinence    Macular degeneration  Memory change    Osteoporosis    Stroke [facility]) 02/2014     Surgical History:  Past Surgical History:  Procedure Laterality Date   TONSILLECTOMY     Varicose veins       Medications Prior to Admission  Medication Sig Dispense Refill Last Dose   cholecalciferol (VITAMIN D) 25 MCG (1000 UNIT) tablet Take 1,000 Units by mouth daily.   57/84/6962 at 9528   folic  acid (FOLVITE) 413 MCG tablet Take 400 mcg by mouth daily.   07/22/2021 at 0800   furosemide (LASIX) 20 MG tablet Take 1 tablet (20 mg total) by mouth daily. 90 tablet 3 07/22/2021 at 0800   latanoprost (XALATAN) 0.005 % ophthalmic solution Place 1 drop into both eyes at bedtime.   07/21/2021   loratadine-pseudoephedrine (CLARITIN-D 12-HOUR) 5-120 MG tablet Take 1 tablet by mouth every 12 (twelve) hours as needed for allergies.   unknown   losartan (COZAAR) 100 MG tablet TAKE 1 TABLET BY MOUTH  DAILY 90 tablet 3 07/22/2021 at 0800   Omega-3 Fatty Acids (FISH OIL PO) Take 1,200 mg by mouth daily.   07/22/2021 at 0800   omeprazole (PRILOSEC) 40 MG capsule Take 40 mg by mouth daily.   07/22/2021 at 0800   ondansetron (ZOFRAN) 4 MG tablet Take 4 mg by mouth every 12 (twelve) hours as needed for nausea or vomiting.   unknown   oxybutynin (DITROPAN) 5 MG tablet Take 5 mg by mouth daily.   07/21/2021   QUEtiapine (SEROQUEL) 25 MG tablet Take 25 mg by mouth at bedtime.   07/21/2021   sertraline (ZOLOFT) 50 MG tablet Take 75 mg by mouth in the morning.   07/22/2021 at 0800   simvastatin (ZOCOR) 40 MG tablet Take 40 mg by mouth at bedtime.   07/21/2021   timolol (TIMOPTIC) 0.5 % ophthalmic solution Place 1 drop into both eyes 2 (two) times daily.   07/22/2021 at 0800   vitamin B-12 (CYANOCOBALAMIN) 1000 MCG tablet Take 1,000 mcg by mouth daily.   07/22/2021 at 0800   XARELTO 20 MG TABS tablet Take 1 tablet (20 mg total) by mouth daily with supper. 90 tablet 1 07/21/2021 at 1700    Inpatient Medications:   acetaminophen  1,000 mg Oral Q6H   lidocaine  1 patch Transdermal Q24H   Rivaroxaban  20 mg Oral Q supper   sertraline  75 mg Oral q AM   simvastatin  40 mg Oral QHS   sodium chloride flush  3 mL Intravenous Q12H    Allergies:  Allergies  Allergen Reactions   Amlodipine Other (See Comments)    Headaches and gagging   Iodine Hives   Cefprozil Other (See Comments)    Listed on MAR   Mirabegron  Other (See Comments)    Listed on MAR   Peppermint Flavor Other (See Comments)    Reaction unknown    Social History   Socioeconomic History   Marital status: Divorced    Spouse name: Not on file   Number of children: 10   Years of education: Not on file   Highest education level: Not on file  Occupational History   Not on file  Tobacco Use   Smoking status: Never   Smokeless tobacco: Never  Vaping Use   Vaping Use: Never used  Substance and Sexual Activity   Alcohol use: Yes    Comment: Rare   Drug use: Never   Sexual activity: Not on file  Other Topics Concern  Not on file  Social History Narrative   Coffee 2 daily   Social Determinants of Health   Financial Resource Strain: Not on file  Food Insecurity: Not on file  Transportation Needs: Not on file  Physical Activity: Not on file  Stress: Not on file  Social Connections: Not on file  Intimate Partner Violence: Not on file     Family History  Problem Relation Age of Onset   Stroke Father    Memory loss Neg Hx      Review of Systems: All other systems reviewed and are otherwise negative except as noted above.  Physical Exam: Vitals:   07/24/21 0337 07/24/21 0549 07/24/21 0741 07/24/21 0800  BP: 114/72  (!) 208/93 (!) 187/71  Pulse: (!) 53   62  Resp: 18   (!) 26  Temp: 98 F (36.7 C)     TempSrc: Axillary     SpO2: 97%   95%  Weight:  79.6 kg    Height:        GEN- The patient is well appearing, alert and oriented x 3 today.   HEENT: normocephalic, atraumatic; sclera clear, conjunctiva pink; hearing intact; oropharynx clear; neck supple Lungs- Clear to ausculation bilaterally, normal work of breathing.  No wheezes, rales, rhonchi Heart- Regular rate and rhythm, no murmurs, rubs or gallops GI- soft, non-tender, non-distended, bowel sounds present Extremities- no clubbing, cyanosis, or edema; DP/PT/radial pulses 2+ bilaterally MS- no significant deformity or atrophy Skin- warm and dry, no rash  or lesion Psych- euthymic mood, full affect Neuro- strength and sensation are intact  Labs:   Lab Results  Component Value Date   WBC 8.8 07/23/2021   HGB 13.1 07/23/2021   HCT 39.4 07/23/2021   MCV 93.8 07/23/2021   PLT 255 07/23/2021    Recent Labs  Lab 07/23/21 0135  NA 137  K 3.4*  CL 101  CO2 29  BUN 12  CREATININE 0.83  CALCIUM 8.7*  GLUCOSE 130*      Radiology/Studies: DG Chest 2 View  Result Date: 07/22/2021 CLINICAL DATA:  Fall. EXAM: CHEST - 2 VIEW COMPARISON:  None. FINDINGS: Patient is slightly rotated. Cardiomediastinal silhouette is within normal limits. There is minimal atelectasis in the left lung base. The lungs are otherwise clear. There is no pleural effusion or pneumothorax. The aorta is ectatic. No acute fractures are identified. IMPRESSION: No active cardiopulmonary disease. Electronically Signed   By: Ronney Asters M.D.   On: 07/22/2021 16:37   CT Head Wo Contrast  Result Date: 07/22/2021 CLINICAL DATA:  Recent fall. EXAM: CT HEAD WITHOUT CONTRAST CT CERVICAL SPINE WITHOUT CONTRAST TECHNIQUE: Multidetector CT imaging of the head and cervical spine was performed following the standard protocol without intravenous contrast. Multiplanar CT image reconstructions of the cervical spine were also generated. COMPARISON:  MRI brain dated March 04, 2021. FINDINGS: CT HEAD FINDINGS Brain: No evidence of acute infarction, hemorrhage, hydrocephalus, extra-axial collection or mass lesion/mass effect. 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 Old lacunar infarcts in the left and right cerebellum. Stable atrophy and chronic microvascular ischemic changes. Vascular: Calcified atherosclerosis at the skull base. No hyperdense vessel. Skull: Negative for fracture or focal lesion. Sinuses/Orbits: No acute finding. Other: None. CT CERVICAL SPINE FINDINGS Alignment: No traumatic malalignment. Straightening of the normal cervical lordosis. Trace anterolisthesis at C2-C3  and C7-T1. Skull base and vertebrae: No acute fracture. No primary bone lesion or focal pathologic process. Soft tissues and spinal canal: No prevertebral fluid or swelling. No  visible canal hematoma. Disc levels: Multilevel disc height loss, severe at C5-C6 and C6-C7. Diffuse severe facet arthropathy throughout the cervical spine. Prior ankylosis of the bilateral C3-C4 and C4-C5 facet joints. Upper chest: Negative. Other: None. IMPRESSION: 1. No acute intracranial abnormality.  Multiple old infarcts. 2. No acute cervical spine fracture or traumatic malalignment. Advanced multilevel cervical spondylosis. Electronically Signed   By: Titus Dubin M.D.   On: 07/22/2021 16:51   CT Cervical Spine Wo Contrast  Result Date: 07/22/2021 CLINICAL DATA:  Recent fall. EXAM: CT HEAD WITHOUT CONTRAST CT CERVICAL SPINE WITHOUT CONTRAST TECHNIQUE: Multidetector CT imaging of the head and cervical spine was performed following the standard protocol without intravenous contrast. Multiplanar CT image reconstructions of the cervical spine were also generated. COMPARISON:  MRI brain dated March 04, 2021. FINDINGS: CT HEAD FINDINGS Brain: No evidence of acute infarction, hemorrhage, hydrocephalus, extra-axial collection or mass lesion/mass effect. 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 Old lacunar infarcts in the left and right cerebellum. Stable atrophy and chronic microvascular ischemic changes. Vascular: Calcified atherosclerosis at the skull base. No hyperdense vessel. Skull: Negative for fracture or focal lesion. Sinuses/Orbits: No acute finding. Other: None. CT CERVICAL SPINE FINDINGS Alignment: No traumatic malalignment. Straightening of the normal cervical lordosis. Trace anterolisthesis at C2-C3 and C7-T1. Skull base and vertebrae: No acute fracture. No primary bone lesion or focal pathologic process. Soft tissues and spinal canal: No prevertebral fluid or swelling. No visible canal hematoma. Disc levels:  Multilevel disc height loss, severe at C5-C6 and C6-C7. Diffuse severe facet arthropathy throughout the cervical spine. Prior ankylosis of the bilateral C3-C4 and C4-C5 facet joints. Upper chest: Negative. Other: None. IMPRESSION: 1. No acute intracranial abnormality.  Multiple old infarcts. 2. No acute cervical spine fracture or traumatic malalignment. Advanced multilevel cervical spondylosis. Electronically Signed   By: Titus Dubin M.D.   On: 07/22/2021 16:51   CT T-SPINE NO CHARGE  Result Date: 07/22/2021 CLINICAL DATA:  Fall. EXAM: CT THORACIC AND LUMBAR SPINE WITHOUT CONTRAST TECHNIQUE: Multiplanar CT images of the thoracic and lumbar spine were reconstructed from contemporary CT of the Chest, Abdomen, and Pelvis CONTRAST:  None. COMPARISON:  None. FINDINGS: CT THORACIC SPINE FINDINGS Alignment: Mild dextroscoliosis. Trace stepwise anterolisthesis from T1-T2 through T3-T4. No traumatic malalignment. Vertebrae: No acute fracture or focal pathologic process. Paraspinal and other soft tissues: Please see separate CT chest, abdomen, and pelvis report from same day. Disc levels: Mild multilevel disc height loss, anterior endplate spurring, and facet arthropathy. CT LUMBAR SPINE FINDINGS Segmentation: 5 lumbar type vertebrae. Alignment: Trace anterolisthesis at L3-L4. 7 mm anterolisthesis at L4-L5. Vertebrae: No acute fracture or focal pathologic process. Chronic moderate L4 compression deformity. Paraspinal and other soft tissues: Please see separate CT chest, abdomen, and pelvis report from same day. Disc levels: Multilevel disc height loss, moderate to severe at L5-S1. Mild-to-moderate lumbar facet arthropathy. At least moderate spinal canal stenosis at L4-L5. Moderate right neuroforaminal stenosis at L2-L3. IMPRESSION: 1. No acute fracture of the thoracic or lumbar spine. 2. Chronic moderate L4 compression deformity. Electronically Signed   By: Titus Dubin M.D.   On: 07/22/2021 18:06   CT L-SPINE  NO CHARGE  Result Date: 07/22/2021 CLINICAL DATA:  Fall. EXAM: CT THORACIC AND LUMBAR SPINE WITHOUT CONTRAST TECHNIQUE: Multiplanar CT images of the thoracic and lumbar spine were reconstructed from contemporary CT of the Chest, Abdomen, and Pelvis CONTRAST:  None. COMPARISON:  None. FINDINGS: CT THORACIC SPINE FINDINGS Alignment: Mild dextroscoliosis. Trace stepwise anterolisthesis from T1-T2  through T3-T4. No traumatic malalignment. Vertebrae: No acute fracture or focal pathologic process. Paraspinal and other soft tissues: Please see separate CT chest, abdomen, and pelvis report from same day. Disc levels: Mild multilevel disc height loss, anterior endplate spurring, and facet arthropathy. CT LUMBAR SPINE FINDINGS Segmentation: 5 lumbar type vertebrae. Alignment: Trace anterolisthesis at L3-L4. 7 mm anterolisthesis at L4-L5. Vertebrae: No acute fracture or focal pathologic process. Chronic moderate L4 compression deformity. Paraspinal and other soft tissues: Please see separate CT chest, abdomen, and pelvis report from same day. Disc levels: Multilevel disc height loss, moderate to severe at L5-S1. Mild-to-moderate lumbar facet arthropathy. At least moderate spinal canal stenosis at L4-L5. Moderate right neuroforaminal stenosis at L2-L3. IMPRESSION: 1. No acute fracture of the thoracic or lumbar spine. 2. Chronic moderate L4 compression deformity. Electronically Signed   By: Titus Dubin M.D.   On: 07/22/2021 18:06   ECHOCARDIOGRAM COMPLETE  Result Date: 07/23/2021    ECHOCARDIOGRAM REPORT   Patient Name:   Rebecca Cannon Date of Exam: 07/23/2021 Medical Rec #:  [id number]        Height:       65.0 in Accession #:    [id number]       Weight:       190.0 lb Date of Birth:  04-21-1929        BSA:          1.935 m Patient Age:    60 years         BP:           172/62 mmHg Patient Gender: F                HR:           40 bpm. Exam Location:  Inpatient Procedure: 2D Echo, Cardiac Doppler and Color Doppler  Indications:    R55 Syncope  History:        Patient has prior history of Echocardiogram examinations, most                 recent 08/27/2019. Arrythmias:Atrial Fibrillation; Risk                 Factors:Hypertension and Hyperlipidemia.  Sonographer:    Glo Herring Referring Phys: [id number] [place]  1. Left ventricular ejection fraction, by estimation, is 65 to 70%. The left ventricle has normal function. The left ventricle has no regional wall motion abnormalities. There is mild left ventricular hypertrophy. Left ventricular diastolic function could not be evaluated.  2. Right ventricular systolic function is mildly reduced. The right ventricular size is normal. There is normal pulmonary artery systolic pressure. The estimated right ventricular systolic pressure is 55.7 mmHg.  3. Left atrial size was severely dilated.  4. Right atrial size was moderately dilated.  5. The mitral valve is abnormal. Mild mitral valve regurgitation.  6. The aortic valve is tricuspid. Aortic valve regurgitation is trivial. Aortic valve sclerosis/calcification is present, without any evidence of aortic stenosis.  7. The inferior vena cava is dilated in size with >50% respiratory variability, suggesting right atrial pressure of 8 mmHg.  8. Rhythm strip during this exam demostrated Afib with slow ventricular response. Comparison(s): Changes from prior study are noted. 08/27/2019: LVEF 55-60%. FINDINGS  Left Ventricle: Left ventricular ejection fraction, by estimation, is 65 to 70%. The left ventricle has normal function. The left ventricle has no regional wall motion abnormalities. The left ventricular internal cavity size was normal in size. There is  mild left ventricular hypertrophy. Left ventricular diastolic function could not be evaluated due to atrial fibrillation. Left ventricular diastolic function could not be evaluated. Right Ventricle: The right ventricular size is normal. No increase in right ventricular  wall thickness. Right ventricular systolic function is mildly reduced. There is normal pulmonary artery systolic pressure. The tricuspid regurgitant velocity is 2.57 m/s, and with an assumed right atrial pressure of 8 mmHg, the estimated right ventricular systolic pressure is 86.7 mmHg. Left Atrium: Left atrial size was severely dilated. Right Atrium: Right atrial size was moderately dilated. Pericardium: There is no evidence of pericardial effusion. Mitral Valve: The mitral valve is abnormal. There is mild thickening of the mitral valve leaflet(s). Mild mitral annular calcification. Mild mitral valve regurgitation. MV peak gradient, 5.3 mmHg. The mean mitral valve gradient is 1.0 mmHg. Tricuspid Valve: The tricuspid valve is grossly normal. Tricuspid valve regurgitation is mild. Aortic Valve: The aortic valve is tricuspid. Aortic valve regurgitation is trivial. Aortic valve sclerosis/calcification is present, without any evidence of aortic stenosis. Aortic valve mean gradient measures 5.0 mmHg. Aortic valve peak gradient measures 11.6 mmHg. Aortic valve area, by VTI measures 1.43 cm. Pulmonic Valve: The pulmonic valve was normal in structure. Pulmonic valve regurgitation is trivial. Aorta: The aortic root and ascending aorta are structurally normal, with no evidence of dilitation. Venous: The inferior vena cava is dilated in size with greater than 50% respiratory variability, suggesting right atrial pressure of 8 mmHg. IAS/Shunts: No atrial level shunt detected by color flow Doppler. EKG: Rhythm strip during this exam demostrated Afib with slow ventricular response.  LEFT VENTRICLE PLAX 2D LVIDd:         5.10 cm   Diastology LVIDs:         3.20 cm   LV e' medial:    5.11 cm/s LV PW:         1.10 cm   LV E/e' medial:  19.8 LV IVS:        1.10 cm   LV e' lateral:   13.70 cm/s LVOT diam:     2.00 cm   LV E/e' lateral: 7.4 LV SV:         51 LV SV Index:   26 LVOT Area:     3.14 cm  RIGHT VENTRICLE            IVC RV  Basal diam:  4.50 cm    IVC diam: 2.60 cm RV S prime:     8.55 cm/s LEFT ATRIUM              Index        RIGHT ATRIUM           Index LA diam:        4.90 cm  2.53 cm/m   RA Area:     26.45 cm LA Vol (A2C):   110.0 ml 56.83 ml/m  RA Volume:   81.10 ml  41.90 ml/m LA Vol (A4C):   96.9 ml  50.07 ml/m LA Biplane Vol: 110.0 ml 56.83 ml/m  AORTIC VALVE                    PULMONIC VALVE AV Area (Vmax):    1.24 cm     PV Vmax:       0.78 m/s AV Area (Vmean):   1.35 cm     PV Peak grad:  2.5 mmHg AV Area (VTI):     1.43 cm AV Vmax:  170.00 cm/s AV Vmean:          94.900 cm/s AV VTI:            0.356 m AV Peak Grad:      11.6 mmHg AV Mean Grad:      5.0 mmHg LVOT Vmax:         67.20 cm/s LVOT Vmean:        40.900 cm/s LVOT VTI:          0.162 m LVOT/AV VTI ratio: 0.46  AORTA Ao Root diam: 3.60 cm Ao Asc diam:  3.50 cm MITRAL VALVE                TRICUSPID VALVE MV Area (PHT): 3.99 cm     TR Peak grad:   26.4 mmHg MV Area VTI:   1.51 cm     TR Vmax:        257.00 cm/s MV Peak grad:  5.3 mmHg MV Mean grad:  1.0 mmHg     SHUNTS MV Vmax:       1.15 m/s     Systemic VTI:  0.16 m MV Vmean:      50.7 cm/s    Systemic Diam: 2.00 cm MV Decel Time: 190 msec MV E velocity: 101.00 cm/s MV A velocity: 34.70 cm/s MV E/A ratio:  2.91 Lyman Bishop MD Electronically signed by Lyman Bishop MD Signature Date/Time: 07/23/2021/1:09:47 PM    Final    CT CHEST ABDOMEN PELVIS WO CONTRAST  Result Date: 07/22/2021 CLINICAL DATA:  Rib pain after fall. EXAM: CT CHEST, ABDOMEN AND PELVIS WITHOUT CONTRAST TECHNIQUE: Multidetector CT imaging of the chest, abdomen and pelvis was performed following the standard protocol without IV contrast. COMPARISON:  None. FINDINGS: CT CHEST FINDINGS Cardiovascular: Mild cardiomegaly with left atrial enlargement. No pericardial effusion. No thoracic aortic aneurysm. Coronary, aortic arch, and branch vessel atherosclerotic vascular disease. Mediastinum/Nodes: Mildly enlarged precarinal lymph  node measuring 1.2 cm in short axis, likely reactive. No other enlarged mediastinal lymph nodes. No enlarged axillary lymph nodes. The thyroid gland, trachea, and esophagus demonstrate no significant findings. Lungs/Pleura: No focal consolidation, pleural effusion, or pneumothorax. Irregular 11 x 8 mm subpleural pulmonary nodule in the right lower lobe (series 4, image 94). Calcified left apical pleuroparenchymal scarring. Musculoskeletal: Acute nondisplaced fracture of the right lateral sixth rib. Acute minimally displaced fracture of right posterior eighth rib. CT ABDOMEN PELVIS FINDINGS Hepatobiliary: Multiple hepatic cysts measuring up to 2.5 cm. Two large gallstones. No gallbladder wall thickening or biliary dilatation. Pancreas: Unremarkable. No pancreatic ductal dilatation or surrounding inflammatory changes. Spleen: Normal in size without focal abnormality. Adrenals/Urinary Tract: Adrenal glands are unremarkable. Kidneys are normal, without renal calculi, focal lesion, or hydronephrosis. Bladder is unremarkable. Stomach/Bowel: Stomach is within normal limits. Diminutive, normal appendix. No evidence of bowel wall thickening, distention, or inflammatory changes. Severe diffuse colonic diverticulosis. Vascular/Lymphatic: Aortic atherosclerosis. No enlarged abdominal or pelvic lymph nodes. Reproductive: Status post hysterectomy. No adnexal masses. Other: Small fat containing right inguinal hernia. No free fluid or pneumoperitoneum. Musculoskeletal: No acute or significant osseous findings. Chronic L4 compression deformity. IMPRESSION: Chest: 1. Acute right lateral sixth and right posterior eighth rib fractures. No pneumothorax. 2. Irregular 11 x 8 mm subpleural pulmonary nodule in the right lower lobe. Consider a non-contrast Chest CT at 3 months, a PET/CT, or tissue sampling. These guidelines do not apply to immunocompromised patients and patients with cancer. Follow up in patients with significant  comorbidities as clinically warranted. For lung  cancer screening, adhere to Lung-RADS guidelines. Reference: Radiology. 2017; 284(1):228-43. Abdomen and pelvis: 1. No acute intra-abdominal process. 2. Cholelithiasis. 3. Severe diffuse colonic diverticulosis without evidence of acute diverticulitis. 4. Aortic Atherosclerosis (ICD10-I70.0). Electronically Signed   By: Titus Dubin M.D.   On: 07/22/2021 17:59    EKG: on admission showed AF with slow VR at 58 bpm (personally reviewed) EKG 11/13 with AF slow VR vs CHB at 36 bpm  TELEMETRY: AF with slow VR as low as into the 30s, appears asymptomatic (personally reviewed)  Assessment/Plan:  1.  AF with slow VR She is not on rate control This is a chronic issue per monitor 09/2019 and vitals as far back as 2020 She would prefer to avoid a pacer of possible. Could consider loop recorder for long term monitoring of HR.  EF 65-70% by Echo 07/23/2021  2. Mechanical Fall Pt adamantly denies syncope. She remembers walking back from exercise, turning a corner, being surprised that there were chairs or a table/bed right around the corner. She clipped her wheel on one of them and remembers falling and hitting the ground.   3. Right-sided rib fractures Per primary -Secondary to fall -No evidence of pneumothorax on chest x-ray -Treat supportively with pain management  4. Mild to moderate cognitive impairment -Recently evaluated by neurology with recommendations to start low-dose Namenda -Patient was unwilling to start at that time  She is NPO currently in the event a pacemaker is indicated. I do not think there is an urgent need for pacing, and left sided pacing could potentially inhibit her ability to rehab from rib fractures.  Will have MD see for disposition.   ADDENDUM Dr. Lovena Le has seen the patient and no indication for pacing at this time with chronic intermittent bradycardia that is asymptomatic. She can discuss monitor as outpatient with  Dr. [name], at her request.  No further cardiac work up indicated at this time.   I also discussed her admission and current plan with her son, who verbalized understanding. He remains frustrated that patient was transferred to two separate hospitals (From [facility] to [facility] and then finally to [facility]). I have asked the director of [facility] to speak with him.  For questions or updates, please contact [place] Please consult [email or web address] for contact info under Cardiology/STEMI.  Jacalyn Lefevre, PA-C  07/24/2021 8:11 AM  EP Attending  Patient seen and examined. Her chart has been reviewed. She is a pleasant, demented elderly woman with chronic atrial fib and tachy-brady syndrome who was admitted after a fall when she was noted to be bradycardic. The patient is certain she did not pass out. She is on no AV nodal blocking drugs and feels well though she is frustrated by her living situation. On exam she is a agitated elderly woman, NAD. CV with an IReg rhythm with no murmurs. Ext are warm. Neuro is non-focal. Tele reveals Atrial fib with a slow VR and CVR. No long pauses A/P Bradycardia - it does not appear that she is symptomatic. She will undergo watchful waiting. She is not interest in PPM. ATrial fib - this appears to be chronic.  Coags - she has not had any bleeding. She will continue xarelto for now.  Rebecca Overlie Garnell Phenix,MD

## 2021-07-25 DIAGNOSIS — S2241XA Multiple fractures of ribs, right side, initial encounter for closed fracture: Secondary | ICD-10-CM | POA: Diagnosis not present

## 2021-07-25 DIAGNOSIS — Y92009 Unspecified place in unspecified non-institutional (private) residence as the place of occurrence of the external cause: Secondary | ICD-10-CM | POA: Diagnosis not present

## 2021-07-25 DIAGNOSIS — W19XXXA Unspecified fall, initial encounter: Secondary | ICD-10-CM | POA: Diagnosis not present

## 2021-07-25 DIAGNOSIS — I4891 Unspecified atrial fibrillation: Secondary | ICD-10-CM | POA: Diagnosis not present

## 2021-07-25 LAB — GLUCOSE, CAPILLARY: Glucose-Capillary: 123 mg/dL — ABNORMAL HIGH (ref 70–99)

## 2021-07-25 LAB — RESP PANEL BY RT-PCR (FLU A&B, COVID) ARPGX2
Influenza A by PCR: NEGATIVE
Influenza B by PCR: NEGATIVE
SARS Coronavirus 2 by RT PCR: NEGATIVE

## 2021-07-25 MED ORDER — POTASSIUM CHLORIDE CRYS ER 20 MEQ PO TBCR
40.0000 meq | EXTENDED_RELEASE_TABLET | Freq: Once | ORAL | Status: AC
  Administered 2021-07-25: 40 meq via ORAL
  Filled 2021-07-25: qty 2

## 2021-07-25 NOTE — Progress Notes (Signed)
Occupational Therapy Treatment Patient Details Name: Rebecca Cannon MRN: [id number] DOB: 29-Dec-1928 Today's Date: 07/25/2021   History of present illness Pt is a 87 year old woman admitted to [facility] after falling at her ALF 2 days prior to admission on 07/22/21. Fall resulted in R side rib fxs x 2. LLL nodule incidentally found. Pt noted to have bradycardia, transferred to [facility] 07/23/21 for possible pacemaker. PMH: afib, HTN, HLD, CVA, mild cognitive impairment, macular degeneration, osteoporosis, arthritis.   OT comments  Patient received in bed and agreeable to transfer to recliner. Patient required increased time to get to eob and bed pad used to assist with getting hips to EOB due to right rib pain. Patient required increased time before attempting transfer into recliner. Patient was uncomfortable with RW use for transfer and was assisted with transfer to recliner with therapist in front of patient for support but demonstrated fair standing balance once up.  Patient performed grooming and was setup for breakfast seated up in recliner. Patient continued to have complaints of right rib pain and nursing was notified. Acute OT to continue to follow.    Recommendations for follow up therapy are one component of a multi-disciplinary discharge planning process, led by the attending physician.  Recommendations may be updated based on patient status, additional functional criteria and insurance authorization.    Follow Up Recommendations  Home health OT    Assistance Recommended at Discharge Frequent or constant Supervision/Assistance  Equipment Recommendations  BSC/3in1;Hospital bed    Recommendations for Other Services      Precautions / Restrictions Precautions Precautions: Fall Restrictions Weight Bearing Restrictions: No       Mobility Bed Mobility Overal bed mobility: Needs Assistance Bed Mobility: Supine to Sit     Supine to sit: HOB elevated;Mod assist     General bed mobility  comments: used bed pads to assist with positioning hips    Transfers Overall transfer level: Needs assistance Equipment used: None Transfers: Sit to/from Stand;Bed to chair/wheelchair/BSC Sit to Stand: Mod assist   Step pivot transfers: Mod assist       General transfer comment: assistance with balance for transfer     Balance Overall balance assessment: Needs assistance   Sitting balance-Leahy Scale: Fair Sitting balance - Comments: limited by rib pain   Standing balance support: Bilateral upper extremity supported Standing balance-Leahy Scale: Poor Standing balance comment: reliant on BUE for support when standing                           ADL either performed or assessed with clinical judgement   ADL Overall ADL's : Needs assistance/impaired Eating/Feeding: Sitting;Minimal assistance Eating/Feeding Details (indicate cue type and reason): assist to open containers Grooming: Set up;Sitting;Wash/dry hands;Wash/dry face Grooming Details (indicate cue type and reason): performed seated in recliner                               General ADL Comments: patient was limited by rib pain for sitting on eob    Extremity/Trunk Assessment Upper Extremity Assessment Upper Extremity Assessment: RUE deficits/detail RUE Deficits / Details: arthritic changes in hand, limited use due to rib pain RUE Coordination: decreased fine motor;decreased gross motor LUE Deficits / Details: arthritic changes in hand            Vision       Perception     Praxis  Cognition Arousal/Alertness: Awake/alert Behavior During Therapy: Anxious Overall Cognitive Status: History of cognitive impairments - at baseline                                 General Comments: appearful of falling during transfer          Exercises     Shoulder Instructions       General Comments      Pertinent Vitals/ Pain       Pain Assessment: Faces Faces Pain  Scale: Hurts whole lot Pain Location: R ribs Pain Descriptors / Indicators: Grimacing;Guarding;Moaning Pain Intervention(s): Monitored during session;Patient requesting pain meds-RN notified  Home Living                                          Prior Functioning/Environment              Frequency  Min 2X/week        Progress Toward Goals  OT Goals(current goals can now be found in the care plan section)  Progress towards OT goals: Progressing toward goals  Acute Rehab OT Goals Patient Stated Goal: go back to ALF OT Goal Formulation: With patient Time For Goal Achievement: 08/07/21 Potential to Achieve Goals: Fair ADL Goals Pt Will Perform Grooming: standing;with min guard assist Pt Will Perform Upper Body Bathing: with min assist;sitting Pt Will Perform Upper Body Dressing: sitting;with supervision Pt Will Transfer to Toilet: with min guard assist;ambulating;bedside commode Pt Will Perform Toileting - Clothing Manipulation and hygiene: with min assist;sit to/from stand Additional ADL Goal #1: Pt will perform bed mobility with supervision using bed rail.  Plan Discharge plan remains appropriate    Co-evaluation                 AM-PAC OT "6 Clicks" Daily Activity     Outcome Measure   Help from another person eating meals?: A Little Help from another person taking care of personal grooming?: A Little Help from another person toileting, which includes using toliet, bedpan, or urinal?: Total Help from another person bathing (including washing, rinsing, drying)?: A Lot Help from another person to put on and taking off regular upper body clothing?: A Little Help from another person to put on and taking off regular lower body clothing?: Total 6 Click Score: 13    End of Session Equipment Utilized During Treatment: Oxygen  OT Visit Diagnosis: Unsteadiness on feet (R26.81);Other abnormalities of gait and mobility (R26.89);Pain;Other symptoms  and signs involving cognitive function;Muscle weakness (generalized) (M62.81)   Activity Tolerance Patient limited by pain   Patient Left in chair;with call bell/phone within reach;with chair alarm set   Nurse Communication Patient requests pain meds        Time: 1443-1540 OT Time Calculation (min): 35 min  Charges: OT General Charges $OT Visit: 1 Visit OT Treatments $Self Care/Home Management : 23-37 mins  Lodema Hong, [place]  Pager [phone] [place] 07/25/2021, 8:55 AM

## 2021-07-25 NOTE — Progress Notes (Signed)
K+ 3.4 this AM. Ok to give KCL 61meq x1 per Dr. Sarajane Jews.  Onnie Boer, PharmD, BCIDP, AAHIVP, CPP Infectious Disease Pharmacist 07/25/2021 7:26 AM

## 2021-07-25 NOTE — TOC Progression Note (Addendum)
Transition of Care [facility]) - Progression Note    Patient Details  Name: Rebecca Cannon MRN: [id number] Date of Birth: [birth year]  [facility]) CM/SW Contact  Joanne Chars, LCSW Phone Number: 07/25/2021, 10:19 AM  Clinical Narrative:   CSW received message from Counsellor at [place] on [employer].  They cannot accept pt back in her current condition and need her to go to SNF rehab first.  CSW spoke with pt son Rebecca Cannon, who had already been informed of this decision by [place].  Pt SSN not in epic, provided by Rebecca Cannon and is [id number].    Pt referral sent out in hub.  Son prefers SNF in [place] area.   1230: Multiple bed offers.  Son chooses [facility], confirmed with Juliann Pulse at [facility], can take tomorrow.  Auth submitted in [place]    Expected Discharge Plan: Assisted Living Barriers to Discharge: Other (must enter comment) (pending acceptance back to [facility]F)  Expected Discharge Plan and Services Expected Discharge Plan: Assisted Living In-house Referral: Clinical Social Work   Post Acute Care Choice: Home Health (at [place]) Living arrangements for the past 2 months: [place]                                       Social Determinants of Health (SDOH) Interventions    Readmission Risk Interventions No flowsheet data found.

## 2021-07-25 NOTE — Progress Notes (Signed)
Progress Note Rebecca Cannon   INO:[id number]  DOB: 03/19/1929  DOA: 07/22/2021     2 Date of Service: 07/25/2021   Clinical Course 87 year old woman lives at [facility], [facility] including atrial fibrillation on rivaroxaban, presented to the emergency department at [facility] after a fall.  Initial concern was for possible symptomatic bradycardia given slow ventricular response, atrial fibrillation, decision was made to transfer [facility] to [facility] for electrophysiology evaluation.   --11/12 admit to [facility] for fall, possible symptomatic bradycardia, multiple rib fractures w/ HR 30-40s in ED --11/13 transfer to [facility] for EP eval --11/14 subsequent interview with patient, information conflicts without initially obtained, patient reporting a mechanical fall secondary to chairs in the hallway.  EP evaluation recommended watchful waiting.  Has some pain in ribs on the right secondary to fractures.  Not on oxygen at home, on minimal oxygen here. --11/15 patiently medically stable, was assessed by PT with recommendation for SNF prior to return to ALF (CSW confirmed ALF could not receive back at this time)  Assessment and Plan * Fall at home, initial encounter -- Most likely mechanical fall in nature.  There was initial concern for cardiogenic possibility, son reports initially the patient had no memory of the event, but the patient is adamant she did not lose consciousness and that this was a mechanical fall. --EP evaluation appreciated, watchful waiting recommended, no rate control agents, continue anticoagulation, no indication for pacemaker at this time. -- PT recommended SNF, CSW discussed with ALF which could not accept patient.  Plan for SNF.  Atrial fibrillation with slow ventricular response (HCC) -- Chronic in nature, no recent falls per son, no obvious significant abnormalities during this hospitalization -- Has been on timolol for some time, given fall was mechanical in nature, recommend  continuing at this time. Other medications include Ditropan which recommend discontinuing. -- Continue Xarelto.  Discussed risk/benefit with son, he wants to continue  -- Echocardiogram normal LVEF with normal function and no wall motion abnormalities  Rib fractures -- CT showed 2 rib fractures on the right side without complicating features, no pneumothorax. -- Continue pain control, incentive spirometry, mobility -- No clear oxygen need documented, wean off oxygen if possible  Hyperlipidemia -- Stable, continue statin  Acquired thrombophilia (HCC) --secondary to afib, continue rivaroxaban   Nodule of lower lobe of right lung -- irregular 11 x 8 mm subpleural pulmonary nodule in the right lower lobe. Consider a non-contrast Chest CT at 3 months, a PET/CT, or tissue sampling.  -- Outpatient follow-up recommended, refer to pulmonary nodule clinic.  Benign essential HTN -- Son reports has been high in the outpatient setting, difficult to assess presently with rib fractures and pain -- Continue losartan, Lasix.  Son reports patient could not tolerate amlodipine. -- Follow-up as an outpatient  Aortic atherosclerosis ([place]) --continue statin  Subjective:  Patien has some rib pain but is breathing okay.  Dislikes the food.  Objective Vitals:   07/24/21 2358 07/25/21 0334 07/25/21 0800 07/25/21 0900  BP: (!) 155/67 139/71 (!) 168/80   Pulse: (!) 56 (!) 53 (!) 57   Resp: 16 14 16    Temp: 98.3 F (36.8 C) 98.7 F (37.1 C)    TempSrc: Oral Oral    SpO2: 98% 97% 97% 98%  Weight:  82.1 kg    Height:       82.1 kg  Vital signs were reviewed and unremarkable except for: Mild bradycardia Telemetry showed atrial fibrillation  Exam Physical Exam Vitals reviewed.  Constitutional:      General: She is not in acute distress. Cardiovascular:     Rate and Rhythm: Bradycardia present. Rhythm irregular.     Heart sounds: No murmur heard. Pulmonary:     Effort: Pulmonary effort is  normal. No respiratory distress.     Breath sounds: No wheezing, rhonchi or rales.  Psychiatric:        Mood and Affect: Mood normal.        Behavior: Behavior normal.    Labs / Other Information My review of labs, imaging, notes and other tests is significant for    stable CBG.  Disposition Plan: Status is: Inpatient  Remains inpatient appropriate because: Needs SNF.  Xarelto Updated son [name] by telephone, he is aware of plan to discharge when authorization is obtained, today or tomorrow  Time spent: 25 minutes [facility] 07/25/2021, 1:32 PM

## 2021-07-25 NOTE — NC FL2 (Signed)
[place] MEDICAID FL2 LEVEL OF CARE SCREENING TOOL     IDENTIFICATION  Patient Name: Rebecca Cannon Birthdate: May 23, 1929 Sex: female Admission Date (Current Location): 07/22/2021  [facility] and [place] Number:  Herbalist and Address:  [facility], [place] [address], [place],  [address]      Provider Number: [id number]  Attending Physician Name and Address:  Samuella Cota, MD  Relative Name and Phone Number:  tyliah, schlereth   [phone]    Current Level of Care: Hospital Recommended Level of Care: [place] Prior Approval Number:    Date Approved/Denied:   PASRR Number: [id number]  Discharge Plan: SNF    Current Diagnoses: Patient Active Problem List   Diagnosis Date Noted   Aortic atherosclerosis ([place]) 07/24/2021   Diverticulosis 07/24/2021   Fall at home, initial encounter 07/24/2021   Atrial fibrillation with slow ventricular response ([place]) 07/24/2021   Acquired thrombophilia ([place]) 07/24/2021   Syncope 07/22/2021   Permanent atrial fibrillation ([place]) 07/22/2021   Rib fractures 07/22/2021   Benign essential HTN    Hyperlipidemia    Nodule of lower lobe of right lung     Orientation RESPIRATION BLADDER Height & Weight     Self, Time, Situation, Place  O2 External catheter Weight: 181 lb (82.1 kg) Height:  5\' 5"  (165.1 cm)  BEHAVIORAL SYMPTOMS/MOOD NEUROLOGICAL BOWEL NUTRITION STATUS      Continent Diet (see discharge summary)  AMBULATORY STATUS COMMUNICATION OF NEEDS Skin   Limited Assist Verbally Other (Comment) (ecchymosis)                       Personal Care Assistance Level of Assistance  Bathing, Feeding, Dressing Bathing Assistance: Maximum assistance Feeding assistance: Limited assistance Dressing Assistance: Maximum assistance     Functional Limitations Info  Sight, Hearing, Speech Sight Info: Adequate Hearing Info: Adequate Speech Info: Adequate    SPECIAL CARE FACTORS  FREQUENCY  PT (By licensed PT), OT (By licensed OT)     PT Frequency: 5x week OT Frequency: 5x week            Contractures Contractures Info: Not present    Additional Factors Info  Code Status, Allergies Code Status Info: DNR Allergies Info: Amlodipine, Iodine, Cefprozil, Mirabegron, Peppermint Flavor           Current Medications (07/25/2021):  This is the current hospital active medication list Current Facility-Administered Medications  Medication Dose Route Frequency Provider Last Rate Last Admin   acetaminophen (TYLENOL) tablet 650 mg  650 mg Oral Q6H Samuella Cota, MD   650 mg at 07/25/21 0545   furosemide (LASIX) tablet 20 mg  20 mg Oral Daily Samuella Cota, MD   20 mg at 07/25/21 0947   hydrALAZINE (APRESOLINE) injection 5 mg  5 mg Intravenous Q6H PRN Kathie Dike, MD   5 mg at 07/24/21 0741   latanoprost (XALATAN) 0.005 % ophthalmic solution 1 drop  1 drop Both Eyes QHS Samuella Cota, MD   1 drop at 07/24/21 2027   lidocaine (LIDODERM) 5 % 1 patch  1 patch Transdermal Q24H Kathie Dike, MD   1 patch at 07/24/21 1513   losartan (COZAAR) tablet 100 mg  100 mg Oral Daily Samuella Cota, MD   100 mg at 07/25/21 0947   ondansetron (ZOFRAN) tablet 4 mg  4 mg Oral Q6H PRN Lenore Cordia, MD       Or  ondansetron (ZOFRAN) injection 4 mg  4 mg Intravenous Q6H PRN Zada Finders R, MD       oxyCODONE (Oxy IR/ROXICODONE) immediate release tablet 5 mg  5 mg Oral Q6H PRN Samuella Cota, MD   5 mg at 07/25/21 0545   rivaroxaban (XARELTO) tablet 20 mg  20 mg Oral Q supper Thomes Lolling, RPH   20 mg at 07/24/21 1808   senna-docusate (Senokot-S) tablet 1 tablet  1 tablet Oral QHS PRN Lenore Cordia, MD       sertraline (ZOLOFT) tablet 75 mg  75 mg Oral q AM Kristopher Oppenheim, DO   75 mg at 07/25/21 0951   simvastatin (ZOCOR) tablet 40 mg  40 mg Oral QHS Regan Lemming, MD   40 mg at 07/24/21 2025   sodium chloride flush (NS) 0.9 % injection 3 mL  3 mL  Intravenous Q12H Lenore Cordia, MD   3 mL at 07/25/21 7591     Discharge Medications: Please see discharge summary for a list of discharge medications.  Relevant Imaging Results:  Relevant Lab Results:   Additional Information SSN: [id number]  Joanne Chars, LCSW

## 2021-07-26 DIAGNOSIS — M2550 Pain in unspecified joint: Secondary | ICD-10-CM | POA: Diagnosis not present

## 2021-07-26 DIAGNOSIS — Z7401 Bed confinement status: Secondary | ICD-10-CM | POA: Diagnosis not present

## 2021-07-26 DIAGNOSIS — R278 Other lack of coordination: Secondary | ICD-10-CM | POA: Diagnosis not present

## 2021-07-26 DIAGNOSIS — Z7409 Other reduced mobility: Secondary | ICD-10-CM | POA: Diagnosis not present

## 2021-07-26 DIAGNOSIS — R001 Bradycardia, unspecified: Secondary | ICD-10-CM | POA: Diagnosis not present

## 2021-07-26 DIAGNOSIS — Z789 Other specified health status: Secondary | ICD-10-CM | POA: Diagnosis not present

## 2021-07-26 DIAGNOSIS — I482 Chronic atrial fibrillation, unspecified: Secondary | ICD-10-CM | POA: Diagnosis not present

## 2021-07-26 DIAGNOSIS — E782 Mixed hyperlipidemia: Secondary | ICD-10-CM | POA: Diagnosis not present

## 2021-07-26 DIAGNOSIS — I4821 Permanent atrial fibrillation: Secondary | ICD-10-CM | POA: Diagnosis not present

## 2021-07-26 DIAGNOSIS — I10 Essential (primary) hypertension: Secondary | ICD-10-CM | POA: Diagnosis not present

## 2021-07-26 DIAGNOSIS — Y92009 Unspecified place in unspecified non-institutional (private) residence as the place of occurrence of the external cause: Secondary | ICD-10-CM | POA: Diagnosis not present

## 2021-07-26 DIAGNOSIS — R911 Solitary pulmonary nodule: Secondary | ICD-10-CM | POA: Diagnosis not present

## 2021-07-26 DIAGNOSIS — E785 Hyperlipidemia, unspecified: Secondary | ICD-10-CM | POA: Diagnosis not present

## 2021-07-26 DIAGNOSIS — S2241XD Multiple fractures of ribs, right side, subsequent encounter for fracture with routine healing: Secondary | ICD-10-CM | POA: Diagnosis not present

## 2021-07-26 DIAGNOSIS — K219 Gastro-esophageal reflux disease without esophagitis: Secondary | ICD-10-CM | POA: Diagnosis not present

## 2021-07-26 DIAGNOSIS — R262 Difficulty in walking, not elsewhere classified: Secondary | ICD-10-CM | POA: Diagnosis not present

## 2021-07-26 DIAGNOSIS — M6281 Muscle weakness (generalized): Secondary | ICD-10-CM | POA: Diagnosis not present

## 2021-07-26 DIAGNOSIS — M1990 Unspecified osteoarthritis, unspecified site: Secondary | ICD-10-CM | POA: Diagnosis not present

## 2021-07-26 DIAGNOSIS — G629 Polyneuropathy, unspecified: Secondary | ICD-10-CM | POA: Diagnosis not present

## 2021-07-26 DIAGNOSIS — D6859 Other primary thrombophilia: Secondary | ICD-10-CM | POA: Diagnosis not present

## 2021-07-26 DIAGNOSIS — I4891 Unspecified atrial fibrillation: Secondary | ICD-10-CM | POA: Diagnosis not present

## 2021-07-26 DIAGNOSIS — W19XXXA Unspecified fall, initial encounter: Secondary | ICD-10-CM | POA: Diagnosis not present

## 2021-07-26 LAB — GLUCOSE, CAPILLARY: Glucose-Capillary: 116 mg/dL — ABNORMAL HIGH (ref 70–99)

## 2021-07-26 MED ORDER — OXYCODONE HCL 5 MG PO TABS: 5.0000 mg | ORAL_TABLET | Freq: Four times a day (QID) | ORAL | 0 refills | Status: DC | PRN

## 2021-07-26 MED ORDER — LIDOCAINE 5 % EX PTCH: 1.0000 | MEDICATED_PATCH | CUTANEOUS | 0 refills | Status: DC

## 2021-07-26 NOTE — TOC Transition Note (Signed)
Transition of Care [facility]) - CM/SW Discharge Note   Patient Details  Name: Mitra Duling MRN: [id number] Date of Birth: [birth year]  Transition of Care [facility]) CM/SW Contact:  Vinie Sill, LCSW Phone Number: 07/26/2021, 11:40 AM   Clinical Narrative:     Patient will Discharge to: [facility] Discharge Date: 07/26/2021 Family Notified: Tom-son Transport By: Corey Harold  Per MD patient is ready for discharge. RN, patient, and facility notified of discharge. Discharge Summary sent to facility. RN given number for [phone], [facility]. Ambulance transport requested for patient.   Clinical Social Worker signing off.  Thurmond Butts, MSW, LCSW Clinical Social Worker     Final next level of care: Skilled Nursing Facility Barriers to Discharge: Barriers Resolved   Patient Goals and CMS Choice   CMS Medicare.gov Compare Post Acute Care list provided to:: Patient Represenative (must comment) (SNF choice document given to son as back up plan) Choice offered to / list presented to : Adult Children  Discharge Placement              Patient [facility] bed at: [facility] Patient to be transferred to facility by: [place] Name of family member notified: [name], son Patient and family notified of of transfer: 07/26/21  Discharge Plan and Services In-house Referral: Clinical Social Work   Post Acute Care Choice: Home Health (at [place])                               Social Determinants of Health (SDOH) Interventions     Readmission Risk Interventions No flowsheet data found.

## 2021-07-26 NOTE — Discharge Summary (Addendum)
Physician Discharge Summary   Patient name: Rebecca Cannon  Admit date:     07/22/2021  Discharge date: 07/26/2021  Discharge Physician: Geradine Girt   PCP: Ramiro Harvest, PA-C   Recommendations at discharge: BMP 1 week, follow up on pulm nodule, DNR, encourage incentive spirometry  Discharge Diagnoses Principal Problem:   Fall at home, initial encounter Active Problems:   Permanent atrial fibrillation (HCC)   Rib fractures   Benign essential HTN   Hyperlipidemia   Nodule of lower lobe of right lung   Aortic atherosclerosis (HCC)   Diverticulosis   Atrial fibrillation with slow ventricular response ([place])   Acquired thrombophilia [facility])      Hospital Course   87 year old woman lives at [facility], [facility] including atrial fibrillation on rivaroxaban, presented to the emergency department at [facility] after a fall.  Initial concern was for possible symptomatic bradycardia given slow ventricular response, atrial fibrillation, decision was made to transfer patient to [facility] for electrophysiology evaluation.   --11/12 admit to [facility] for fall, possible symptomatic bradycardia, multiple rib fractures w/ HR 30-40s in ED --11/13 transfer to [facility] for EP eval --11/14 subsequent interview with patient, information conflicts without initially obtained, patient reporting a mechanical fall secondary to chairs in the hallway.  EP evaluation recommended watchful waiting.  Has some pain in ribs on the right secondary to fractures.  Not on oxygen at home, on minimal oxygen here. --11/15 patiently medically stable, was assessed by PT with recommendation for SNF prior to return to ALF (CSW confirmed ALF could not receive back at this time)   * Fall at home, initial encounter -- Most likely mechanical fall in nature.  There was initial concern for cardiogenic possibility, son reports initially the patient had no memory of the event, but the patient is adamant she did not lose consciousness  and that this was a mechanical fall. --EP evaluation appreciated, watchful waiting recommended, no rate control agents, continue anticoagulation, no indication for pacemaker at this time. -- PT recommended SNF, CSW discussed with ALF which could not accept patient.  Plan for SNF.  Acquired thrombophilia ([place]) --secondary to afib, continue rivaroxaban   Atrial fibrillation with slow ventricular response (HCC) -- Chronic in nature, no recent falls per son, no obvious significant abnormalities during this hospitalization -- Has been on timolol for some time, given fall was mechanical in nature, recommend continuing at this time. Other medications include Ditropan which recommend discontinuing. -- Continue Xarelto.  Discussed risk/benefit with son, he wants to continue  -- Echocardiogram normal LVEF with normal function and no wall motion abnormalities  Aortic atherosclerosis (HCC) --continue statin  Nodule of lower lobe of right lung -- irregular 11 x 8 mm subpleural pulmonary nodule in the right lower lobe. Consider a non-contrast Chest CT at 3 months, a PET/CT, or tissue sampling.  -- Outpatient follow-up recommended, refer to pulmonary nodule clinic.  Hyperlipidemia -- Stable, continue statin  Benign essential HTN -- Son reports has been high in the outpatient setting, difficult to assess presently with rib fractures and pain -- Continue losartan, Lasix.  Son reports patient could not tolerate amlodipine. -- Follow-up as an outpatient  Rib fractures -- CT showed 2 rib fractures on the right side without complicating features, no pneumothorax. -- Continue pain control, incentive spirometry, mobility -- No clear oxygen need documented, wean off oxygen if possible      Condition at discharge: stable  Exam NAD  Disposition: Skilled nursing facility  Discharge time:  greater than 30 minutes.   Allergies as of 07/26/2021       Reactions   Amlodipine Other (See Comments)    Headaches and gagging   Iodine Hives   Cefprozil Other (See Comments)   Listed on MAR   Mirabegron Other (See Comments)   Listed on MAR   Peppermint Flavor Other (See Comments)   Reaction unknown        Medication List     STOP taking these medications    loratadine-pseudoephedrine 5-120 MG tablet Commonly known as: CLARITIN-D 12-hour   oxybutynin 5 MG tablet Commonly known as: DITROPAN       TAKE these medications    cholecalciferol 25 MCG (1000 UNIT) tablet Commonly known as: VITAMIN D Take 1,000 Units by mouth daily.   FISH OIL PO Take 1,200 mg by mouth daily.   folic acid 235 MCG tablet Commonly known as: FOLVITE Take 400 mcg by mouth daily.   furosemide 20 MG tablet Commonly known as: LASIX Take 1 tablet (20 mg total) by mouth daily.   latanoprost 0.005 % ophthalmic solution Commonly known as: XALATAN Place 1 drop into both eyes at bedtime.   lidocaine 5 % Commonly known as: LIDODERM Place 1 patch onto the skin daily. Remove & Discard patch within 12 hours or as directed by MD   losartan 100 MG tablet Commonly known as: COZAAR TAKE 1 TABLET BY MOUTH  DAILY   omeprazole 40 MG capsule Commonly known as: PRILOSEC Take 40 mg by mouth daily.   ondansetron 4 MG tablet Commonly known as: ZOFRAN Take 4 mg by mouth every 12 (twelve) hours as needed for nausea or vomiting.   oxyCODONE 5 MG immediate release tablet Commonly known as: Oxy IR/ROXICODONE Take 1 tablet (5 mg total) by mouth every 6 (six) hours as needed for moderate pain or severe pain.   QUEtiapine 25 MG tablet Commonly known as: SEROQUEL Take 25 mg by mouth at bedtime.   sertraline 50 MG tablet Commonly known as: ZOLOFT Take 75 mg by mouth in the morning.   simvastatin 40 MG tablet Commonly known as: ZOCOR Take 40 mg by mouth at bedtime.   timolol 0.5 % ophthalmic solution Commonly known as: TIMOPTIC Place 1 drop into both eyes 2 (two) times daily.   vitamin B-12 1000 MCG  tablet Commonly known as: CYANOCOBALAMIN Take 1,000 mcg by mouth daily.   Xarelto 20 MG Tabs tablet Generic drug: rivaroxaban Take 1 tablet (20 mg total) by mouth daily with supper.        DG Chest 2 View  Result Date: 07/22/2021 CLINICAL DATA:  Fall. EXAM: CHEST - 2 VIEW COMPARISON:  None. FINDINGS: Patient is slightly rotated. Cardiomediastinal silhouette is within normal limits. There is minimal atelectasis in the left lung base. The lungs are otherwise clear. There is no pleural effusion or pneumothorax. The aorta is ectatic. No acute fractures are identified. IMPRESSION: No active cardiopulmonary disease. Electronically Signed   By: Ronney Asters M.D.   On: 07/22/2021 16:37   CT Head Wo Contrast  Result Date: 07/22/2021 CLINICAL DATA:  Recent fall. EXAM: CT HEAD WITHOUT CONTRAST CT CERVICAL SPINE WITHOUT CONTRAST TECHNIQUE: Multidetector CT imaging of the head and cervical spine was performed following the standard protocol without intravenous contrast. Multiplanar CT image reconstructions of the cervical spine were also generated. COMPARISON:  MRI brain dated March 04, 2021. FINDINGS: CT HEAD FINDINGS Brain: No evidence of acute infarction, hemorrhage, hydrocephalus, extra-axial collection or mass lesion/mass effect. 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 Old lacunar infarcts in the left and right cerebellum. Stable atrophy and chronic microvascular ischemic changes. Vascular: Calcified atherosclerosis at the skull base. No hyperdense vessel. Skull: Negative for fracture or focal lesion. Sinuses/Orbits: No acute finding. Other: None. CT CERVICAL SPINE FINDINGS Alignment: No traumatic malalignment. Straightening of the normal cervical lordosis. Trace anterolisthesis at C2-C3 and C7-T1. Skull base and vertebrae: No acute fracture. No primary bone lesion or focal pathologic process. Soft tissues and spinal canal: No prevertebral fluid or swelling. No visible canal hematoma. Disc  levels: Multilevel disc height loss, severe at C5-C6 and C6-C7. Diffuse severe facet arthropathy throughout the cervical spine. Prior ankylosis of the bilateral C3-C4 and C4-C5 facet joints. Upper chest: Negative. Other: None. IMPRESSION: 1. No acute intracranial abnormality.  Multiple old infarcts. 2. No acute cervical spine fracture or traumatic malalignment. Advanced multilevel cervical spondylosis. Electronically Signed   By: Titus Dubin M.D.   On: 07/22/2021 16:51   CT Cervical Spine Wo Contrast  Result Date: 07/22/2021 CLINICAL DATA:  Recent fall. EXAM: CT HEAD WITHOUT CONTRAST CT CERVICAL SPINE WITHOUT CONTRAST TECHNIQUE: Multidetector CT imaging of the head and cervical spine was performed following the standard protocol without intravenous contrast. Multiplanar CT image reconstructions of the cervical spine were also generated. COMPARISON:  MRI brain dated March 04, 2021. FINDINGS: CT HEAD FINDINGS Brain: No evidence of acute infarction, hemorrhage, hydrocephalus, extra-axial collection or mass lesion/mass effect. 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 Old lacunar infarcts in the left and right cerebellum. Stable atrophy and chronic microvascular ischemic changes. Vascular: Calcified atherosclerosis at the skull base. No hyperdense vessel. Skull: Negative for fracture or focal lesion. Sinuses/Orbits: No acute finding. Other: None. CT CERVICAL SPINE FINDINGS Alignment: No traumatic malalignment. Straightening of the normal cervical lordosis. Trace anterolisthesis at C2-C3 and C7-T1. Skull base and vertebrae: No acute fracture. No primary bone lesion or focal pathologic process. Soft tissues and spinal canal: No prevertebral fluid or swelling. No visible canal hematoma. Disc levels: Multilevel disc height loss, severe at C5-C6 and C6-C7. Diffuse severe facet arthropathy throughout the cervical spine. Prior ankylosis of the bilateral C3-C4 and C4-C5 facet joints. Upper chest: Negative.  Other: None. IMPRESSION: 1. No acute intracranial abnormality.  Multiple old infarcts. 2. No acute cervical spine fracture or traumatic malalignment. Advanced multilevel cervical spondylosis. Electronically Signed   By: Titus Dubin M.D.   On: 07/22/2021 16:51   CT T-SPINE NO CHARGE  Result Date: 07/22/2021 CLINICAL DATA:  Fall. EXAM: CT THORACIC AND LUMBAR SPINE WITHOUT CONTRAST TECHNIQUE: Multiplanar CT images of the thoracic and lumbar spine were reconstructed from contemporary CT of the Chest, Abdomen, and Pelvis CONTRAST:  None. COMPARISON:  None. FINDINGS: CT THORACIC SPINE FINDINGS Alignment: Mild dextroscoliosis. Trace stepwise anterolisthesis from T1-T2 through T3-T4. No traumatic malalignment. Vertebrae: No acute fracture or focal pathologic process. Paraspinal and other soft tissues: Please see separate CT chest, abdomen, and pelvis report from same day. Disc levels: Mild multilevel disc height loss, anterior endplate spurring, and facet arthropathy. CT LUMBAR SPINE FINDINGS Segmentation: 5 lumbar type vertebrae. Alignment: Trace anterolisthesis at L3-L4. 7 mm anterolisthesis at L4-L5. Vertebrae: No acute fracture or focal pathologic process. Chronic moderate L4 compression deformity. Paraspinal and other soft tissues: Please see separate CT chest, abdomen, and pelvis report from same day. Disc levels: Multilevel disc height loss, moderate to severe at L5-S1. Mild-to-moderate lumbar facet arthropathy. At least moderate spinal canal stenosis at L4-L5. Moderate right neuroforaminal stenosis at L2-L3. IMPRESSION: 1.  No acute fracture of the thoracic or lumbar spine. 2. Chronic moderate L4 compression deformity. Electronically Signed   By: Titus Dubin M.D.   On: 07/22/2021 18:06   CT L-SPINE NO CHARGE  Result Date: 07/22/2021 CLINICAL DATA:  Fall. EXAM: CT THORACIC AND LUMBAR SPINE WITHOUT CONTRAST TECHNIQUE: Multiplanar CT images of the thoracic and lumbar spine were reconstructed from  contemporary CT of the Chest, Abdomen, and Pelvis CONTRAST:  None. COMPARISON:  None. FINDINGS: CT THORACIC SPINE FINDINGS Alignment: Mild dextroscoliosis. Trace stepwise anterolisthesis from T1-T2 through T3-T4. No traumatic malalignment. Vertebrae: No acute fracture or focal pathologic process. Paraspinal and other soft tissues: Please see separate CT chest, abdomen, and pelvis report from same day. Disc levels: Mild multilevel disc height loss, anterior endplate spurring, and facet arthropathy. CT LUMBAR SPINE FINDINGS Segmentation: 5 lumbar type vertebrae. Alignment: Trace anterolisthesis at L3-L4. 7 mm anterolisthesis at L4-L5. Vertebrae: No acute fracture or focal pathologic process. Chronic moderate L4 compression deformity. Paraspinal and other soft tissues: Please see separate CT chest, abdomen, and pelvis report from same day. Disc levels: Multilevel disc height loss, moderate to severe at L5-S1. Mild-to-moderate lumbar facet arthropathy. At least moderate spinal canal stenosis at L4-L5. Moderate right neuroforaminal stenosis at L2-L3. IMPRESSION: 1. No acute fracture of the thoracic or lumbar spine. 2. Chronic moderate L4 compression deformity. Electronically Signed   By: Titus Dubin M.D.   On: 07/22/2021 18:06   ECHOCARDIOGRAM COMPLETE  Result Date: 07/23/2021    ECHOCARDIOGRAM REPORT   Patient Name:   LAVONNA LAMPRON Date of Exam: 07/23/2021 Medical Rec #:  [id number]        Height:       65.0 in Accession #:    [id number]       Weight:       190.0 lb Date of Birth:  1929-08-21        BSA:          1.935 m Patient Age:    22 years         BP:           172/62 mmHg Patient Gender: F                HR:           40 bpm. Exam Location:  Inpatient Procedure: 2D Echo, Cardiac Doppler and Color Doppler Indications:    R55 Syncope  History:        Patient has prior history of Echocardiogram examinations, most                 recent 08/27/2019. Arrythmias:Atrial Fibrillation; Risk                  Factors:Hypertension and Hyperlipidemia.  Sonographer:    Glo Herring Referring Phys: [id number] [place]  1. Left ventricular ejection fraction, by estimation, is 65 to 70%. The left ventricle has normal function. The left ventricle has no regional wall motion abnormalities. There is mild left ventricular hypertrophy. Left ventricular diastolic function could not be evaluated.  2. Right ventricular systolic function is mildly reduced. The right ventricular size is normal. There is normal pulmonary artery systolic pressure. The estimated right ventricular systolic pressure is 30.8 mmHg.  3. Left atrial size was severely dilated.  4. Right atrial size was moderately dilated.  5. The mitral valve is abnormal. Mild mitral valve regurgitation.  6. The aortic valve is tricuspid. Aortic valve regurgitation is trivial. Aortic valve sclerosis/calcification is present,  without any evidence of aortic stenosis.  7. The inferior vena cava is dilated in size with >50% respiratory variability, suggesting right atrial pressure of 8 mmHg.  8. Rhythm strip during this exam demostrated Afib with slow ventricular response. Comparison(s): Changes from prior study are noted. 08/27/2019: LVEF 55-60%. FINDINGS  Left Ventricle: Left ventricular ejection fraction, by estimation, is 65 to 70%. The left ventricle has normal function. The left ventricle has no regional wall motion abnormalities. The left ventricular internal cavity size was normal in size. There is  mild left ventricular hypertrophy. Left ventricular diastolic function could not be evaluated due to atrial fibrillation. Left ventricular diastolic function could not be evaluated. Right Ventricle: The right ventricular size is normal. No increase in right ventricular wall thickness. Right ventricular systolic function is mildly reduced. There is normal pulmonary artery systolic pressure. The tricuspid regurgitant velocity is 2.57 m/s, and with an assumed right  atrial pressure of 8 mmHg, the estimated right ventricular systolic pressure is 91.7 mmHg. Left Atrium: Left atrial size was severely dilated. Right Atrium: Right atrial size was moderately dilated. Pericardium: There is no evidence of pericardial effusion. Mitral Valve: The mitral valve is abnormal. There is mild thickening of the mitral valve leaflet(s). Mild mitral annular calcification. Mild mitral valve regurgitation. MV peak gradient, 5.3 mmHg. The mean mitral valve gradient is 1.0 mmHg. Tricuspid Valve: The tricuspid valve is grossly normal. Tricuspid valve regurgitation is mild. Aortic Valve: The aortic valve is tricuspid. Aortic valve regurgitation is trivial. Aortic valve sclerosis/calcification is present, without any evidence of aortic stenosis. Aortic valve mean gradient measures 5.0 mmHg. Aortic valve peak gradient measures 11.6 mmHg. Aortic valve area, by VTI measures 1.43 cm. Pulmonic Valve: The pulmonic valve was normal in structure. Pulmonic valve regurgitation is trivial. Aorta: The aortic root and ascending aorta are structurally normal, with no evidence of dilitation. Venous: The inferior vena cava is dilated in size with greater than 50% respiratory variability, suggesting right atrial pressure of 8 mmHg. IAS/Shunts: No atrial level shunt detected by color flow Doppler. EKG: Rhythm strip during this exam demostrated Afib with slow ventricular response.  LEFT VENTRICLE PLAX 2D LVIDd:         5.10 cm   Diastology LVIDs:         3.20 cm   LV e' medial:    5.11 cm/s LV PW:         1.10 cm   LV E/e' medial:  19.8 LV IVS:        1.10 cm   LV e' lateral:   13.70 cm/s LVOT diam:     2.00 cm   LV E/e' lateral: 7.4 LV SV:         51 LV SV Index:   26 LVOT Area:     3.14 cm  RIGHT VENTRICLE            IVC RV Basal diam:  4.50 cm    IVC diam: 2.60 cm RV S prime:     8.55 cm/s LEFT ATRIUM              Index        RIGHT ATRIUM           Index LA diam:        4.90 cm  2.53 cm/m   RA Area:     26.45 cm LA  Vol (A2C):   110.0 ml 56.83 ml/m  RA Volume:   81.10 ml  41.90 ml/m LA  Vol (A4C):   96.9 ml  50.07 ml/m LA Biplane Vol: 110.0 ml 56.83 ml/m  AORTIC VALVE                    PULMONIC VALVE AV Area (Vmax):    1.24 cm     PV Vmax:       0.78 m/s AV Area (Vmean):   1.35 cm     PV Peak grad:  2.5 mmHg AV Area (VTI):     1.43 cm AV Vmax:           170.00 cm/s AV Vmean:          94.900 cm/s AV VTI:            0.356 m AV Peak Grad:      11.6 mmHg AV Mean Grad:      5.0 mmHg LVOT Vmax:         67.20 cm/s LVOT Vmean:        40.900 cm/s LVOT VTI:          0.162 m LVOT/AV VTI ratio: 0.46  AORTA Ao Root diam: 3.60 cm Ao Asc diam:  3.50 cm MITRAL VALVE                TRICUSPID VALVE MV Area (PHT): 3.99 cm     TR Peak grad:   26.4 mmHg MV Area VTI:   1.51 cm     TR Vmax:        257.00 cm/s MV Peak grad:  5.3 mmHg MV Mean grad:  1.0 mmHg     SHUNTS MV Vmax:       1.15 m/s     Systemic VTI:  0.16 m MV Vmean:      50.7 cm/s    Systemic Diam: 2.00 cm MV Decel Time: 190 msec MV E velocity: 101.00 cm/s MV A velocity: 34.70 cm/s MV E/A ratio:  2.91 Lyman Bishop MD Electronically signed by Lyman Bishop MD Signature Date/Time: 07/23/2021/1:09:47 PM    Final    CT CHEST ABDOMEN PELVIS WO CONTRAST  Result Date: 07/22/2021 CLINICAL DATA:  Rib pain after fall. EXAM: CT CHEST, ABDOMEN AND PELVIS WITHOUT CONTRAST TECHNIQUE: Multidetector CT imaging of the chest, abdomen and pelvis was performed following the standard protocol without IV contrast. COMPARISON:  None. FINDINGS: CT CHEST FINDINGS Cardiovascular: Mild cardiomegaly with left atrial enlargement. No pericardial effusion. No thoracic aortic aneurysm. Coronary, aortic arch, and branch vessel atherosclerotic vascular disease. Mediastinum/Nodes: Mildly enlarged precarinal lymph node measuring 1.2 cm in short axis, likely reactive. No other enlarged mediastinal lymph nodes. No enlarged axillary lymph nodes. The thyroid gland, trachea, and esophagus demonstrate no significant  findings. Lungs/Pleura: No focal consolidation, pleural effusion, or pneumothorax. Irregular 11 x 8 mm subpleural pulmonary nodule in the right lower lobe (series 4, image 94). Calcified left apical pleuroparenchymal scarring. Musculoskeletal: Acute nondisplaced fracture of the right lateral sixth rib. Acute minimally displaced fracture of right posterior eighth rib. CT ABDOMEN PELVIS FINDINGS Hepatobiliary: Multiple hepatic cysts measuring up to 2.5 cm. Two large gallstones. No gallbladder wall thickening or biliary dilatation. Pancreas: Unremarkable. No pancreatic ductal dilatation or surrounding inflammatory changes. Spleen: Normal in size without focal abnormality. Adrenals/Urinary Tract: Adrenal glands are unremarkable. Kidneys are normal, without renal calculi, focal lesion, or hydronephrosis. Bladder is unremarkable. Stomach/Bowel: Stomach is within normal limits. Diminutive, normal appendix. No evidence of bowel wall thickening, distention, or inflammatory changes. Severe diffuse colonic diverticulosis. Vascular/Lymphatic: Aortic atherosclerosis. No enlarged abdominal or  pelvic lymph nodes. Reproductive: Status post hysterectomy. No adnexal masses. Other: Small fat containing right inguinal hernia. No free fluid or pneumoperitoneum. Musculoskeletal: No acute or significant osseous findings. Chronic L4 compression deformity. IMPRESSION: Chest: 1. Acute right lateral sixth and right posterior eighth rib fractures. No pneumothorax. 2. Irregular 11 x 8 mm subpleural pulmonary nodule in the right lower lobe. Consider a non-contrast Chest CT at 3 months, a PET/CT, or tissue sampling. These guidelines do not apply to immunocompromised patients and patients with cancer. Follow up in patients with significant comorbidities as clinically warranted. For lung cancer screening, adhere to Lung-RADS guidelines. Reference: Radiology. 2017; 284(1):228-43. Abdomen and pelvis: 1. No acute intra-abdominal process. 2.  Cholelithiasis. 3. Severe diffuse colonic diverticulosis without evidence of acute diverticulitis. 4. Aortic Atherosclerosis (ICD10-I70.0). Electronically Signed   By: Titus Dubin M.D.   On: 07/22/2021 17:59   Results for orders placed or performed during the hospital encounter of 07/22/21  Resp Panel by RT-PCR (Flu A&B, Covid) Nasopharyngeal Swab     Status: None   Collection Time: 07/22/21  7:55 PM   Specimen: Nasopharyngeal Swab; Nasopharyngeal(NP) swabs in vial transport medium  Result Value Ref Range Status   SARS Coronavirus 2 by RT PCR NEGATIVE NEGATIVE Final    Comment: (NOTE) SARS-CoV-2 target nucleic acids are NOT DETECTED.  The SARS-CoV-2 RNA is generally detectable in upper respiratory specimens during the acute phase of infection. The lowest concentration of SARS-CoV-2 viral copies this assay can detect is 138 copies/mL. A negative result does not preclude SARS-Cov-2 infection and should not be used as the sole basis for treatment or other patient management decisions. A negative result may occur with  improper specimen collection/handling, submission of specimen other than nasopharyngeal swab, presence of viral mutation(s) within the areas targeted by this assay, and inadequate number of viral copies(<138 copies/mL). A negative result must be combined with clinical observations, patient history, and epidemiological information. The expected result is Negative.  Fact Sheet for Patients:  EntrepreneurPulse.com.au  Fact Sheet for Healthcare Providers:  IncredibleEmployment.be  This test is no t yet approved or cleared by the [place] FDA and  has been authorized for detection and/or diagnosis of SARS-CoV-2 by FDA under an Emergency Use Authorization (EUA). This EUA will remain  in effect (meaning this test can be used) for the duration of the COVID-19 declaration under Section 564(b)(1) of the Act, 21 U.S.C.section  360bbb-3(b)(1), unless the authorization is terminated  or revoked sooner.       Influenza A by PCR NEGATIVE NEGATIVE Final   Influenza B by PCR NEGATIVE NEGATIVE Final    Comment: (NOTE) The Xpert Xpress SARS-CoV-2/FLU/RSV plus assay is intended as an aid in the diagnosis of influenza from Nasopharyngeal swab specimens and should not be used as a sole basis for treatment. Nasal washings and aspirates are unacceptable for Xpert Xpress SARS-CoV-2/FLU/RSV testing.  Fact Sheet for Patients: EntrepreneurPulse.com.au  Fact Sheet for Healthcare Providers: IncredibleEmployment.be  This test is not yet approved or cleared by the [place] FDA and has been authorized for detection and/or diagnosis of SARS-CoV-2 by FDA under an Emergency Use Authorization (EUA). This EUA will remain in effect (meaning this test can be used) for the duration of the COVID-19 declaration under Section 564(b)(1) of the Act, 21 U.S.C. section 360bbb-3(b)(1), unless the authorization is terminated or revoked.  Performed at [facility], [address] [place], [place] [address]   MRSA Next Gen by PCR, Nasal  Status: None   Collection Time: 07/23/21  3:19 AM   Specimen: Nasal Mucosa; Nasal Swab  Result Value Ref Range Status   MRSA by PCR Next Gen NOT DETECTED NOT DETECTED Final    Comment: (NOTE) The GeneXpert MRSA Assay (FDA approved for NASAL specimens only), is one component of a comprehensive MRSA colonization surveillance program. It is not intended to diagnose MRSA infection nor to guide or monitor treatment for MRSA infections. Test performance is not FDA approved in patients less than 48 years old. Performed at [facility], [place] [address], [place], [place] [address]   Resp Panel by RT-PCR (Flu A&B, Covid) Nasopharyngeal Swab     Status: None   Collection Time: 07/25/21  1:28 PM   Specimen: Nasopharyngeal Swab;  Nasopharyngeal(NP) swabs in vial transport medium  Result Value Ref Range Status   SARS Coronavirus 2 by RT PCR NEGATIVE NEGATIVE Final    Comment: (NOTE) SARS-CoV-2 target nucleic acids are NOT DETECTED.  The SARS-CoV-2 RNA is generally detectable in upper respiratory specimens during the acute phase of infection. The lowest concentration of SARS-CoV-2 viral copies this assay can detect is 138 copies/mL. A negative result does not preclude SARS-Cov-2 infection and should not be used as the sole basis for treatment or other patient management decisions. A negative result may occur with  improper specimen collection/handling, submission of specimen other than nasopharyngeal swab, presence of viral mutation(s) within the areas targeted by this assay, and inadequate number of viral copies(<138 copies/mL). A negative result must be combined with clinical observations, patient history, and epidemiological information. The expected result is Negative.  Fact Sheet for Patients:  EntrepreneurPulse.com.au  Fact Sheet for Healthcare Providers:  IncredibleEmployment.be  This test is no t yet approved or cleared by the [place] FDA and  has been authorized for detection and/or diagnosis of SARS-CoV-2 by FDA under an Emergency Use Authorization (EUA). This EUA will remain  in effect (meaning this test can be used) for the duration of the COVID-19 declaration under Section 564(b)(1) of the Act, 21 U.S.C.section 360bbb-3(b)(1), unless the authorization is terminated  or revoked sooner.       Influenza A by PCR NEGATIVE NEGATIVE Final   Influenza B by PCR NEGATIVE NEGATIVE Final    Comment: (NOTE) The Xpert Xpress SARS-CoV-2/FLU/RSV plus assay is intended as an aid in the diagnosis of influenza from Nasopharyngeal swab specimens and should not be used as a sole basis for treatment. Nasal washings and aspirates are unacceptable for Xpert Xpress  SARS-CoV-2/FLU/RSV testing.  Fact Sheet for Patients: EntrepreneurPulse.com.au  Fact Sheet for Healthcare Providers: IncredibleEmployment.be  This test is not yet approved or cleared by the [place] FDA and has been authorized for detection and/or diagnosis of SARS-CoV-2 by FDA under an Emergency Use Authorization (EUA). This EUA will remain in effect (meaning this test can be used) for the duration of the COVID-19 declaration under Section 564(b)(1) of the Act, 21 U.S.C. section 360bbb-3(b)(1), unless the authorization is terminated or revoked.  Performed at [facility], [place] [address], [place], [place] [address]     Signed:  Geradine Girt DO.  [facility] 07/26/2021, 10:33 AM

## 2021-08-01 DIAGNOSIS — Z7409 Other reduced mobility: Secondary | ICD-10-CM | POA: Diagnosis not present

## 2021-08-01 DIAGNOSIS — R001 Bradycardia, unspecified: Secondary | ICD-10-CM | POA: Diagnosis not present

## 2021-08-01 DIAGNOSIS — I482 Chronic atrial fibrillation, unspecified: Secondary | ICD-10-CM | POA: Diagnosis not present

## 2021-08-01 DIAGNOSIS — E785 Hyperlipidemia, unspecified: Secondary | ICD-10-CM | POA: Diagnosis not present

## 2021-08-01 DIAGNOSIS — R911 Solitary pulmonary nodule: Secondary | ICD-10-CM | POA: Diagnosis not present

## 2021-08-01 DIAGNOSIS — S2241XD Multiple fractures of ribs, right side, subsequent encounter for fracture with routine healing: Secondary | ICD-10-CM | POA: Diagnosis not present

## 2021-08-01 DIAGNOSIS — Z789 Other specified health status: Secondary | ICD-10-CM | POA: Diagnosis not present

## 2021-08-07 DIAGNOSIS — G629 Polyneuropathy, unspecified: Secondary | ICD-10-CM | POA: Diagnosis not present

## 2021-08-09 DIAGNOSIS — E782 Mixed hyperlipidemia: Secondary | ICD-10-CM | POA: Diagnosis not present

## 2021-08-09 DIAGNOSIS — N3281 Overactive bladder: Secondary | ICD-10-CM | POA: Diagnosis not present

## 2021-08-09 DIAGNOSIS — K219 Gastro-esophageal reflux disease without esophagitis: Secondary | ICD-10-CM | POA: Diagnosis not present

## 2021-08-22 DIAGNOSIS — I4891 Unspecified atrial fibrillation: Secondary | ICD-10-CM | POA: Diagnosis not present

## 2021-08-22 DIAGNOSIS — R911 Solitary pulmonary nodule: Secondary | ICD-10-CM | POA: Diagnosis not present

## 2021-08-22 DIAGNOSIS — I6389 Other cerebral infarction: Secondary | ICD-10-CM | POA: Diagnosis not present

## 2021-08-22 DIAGNOSIS — D6869 Other thrombophilia: Secondary | ICD-10-CM | POA: Diagnosis not present

## 2021-08-22 DIAGNOSIS — I700 Atherosclerosis of aorta: Secondary | ICD-10-CM | POA: Diagnosis not present

## 2021-08-22 DIAGNOSIS — Z7901 Long term (current) use of anticoagulants: Secondary | ICD-10-CM | POA: Diagnosis not present

## 2021-08-22 DIAGNOSIS — Z0000 Encounter for general adult medical examination without abnormal findings: Secondary | ICD-10-CM | POA: Diagnosis not present

## 2021-08-22 DIAGNOSIS — K219 Gastro-esophageal reflux disease without esophagitis: Secondary | ICD-10-CM | POA: Diagnosis not present

## 2021-08-22 DIAGNOSIS — I10 Essential (primary) hypertension: Secondary | ICD-10-CM | POA: Diagnosis not present

## 2021-08-30 ENCOUNTER — Ambulatory Visit: Payer: Medicare Other | Admitting: Family Medicine

## 2021-09-06 DIAGNOSIS — E782 Mixed hyperlipidemia: Secondary | ICD-10-CM | POA: Diagnosis not present

## 2021-09-06 DIAGNOSIS — K219 Gastro-esophageal reflux disease without esophagitis: Secondary | ICD-10-CM | POA: Diagnosis not present

## 2021-09-06 DIAGNOSIS — N3281 Overactive bladder: Secondary | ICD-10-CM | POA: Diagnosis not present

## 2021-10-11 DIAGNOSIS — K219 Gastro-esophageal reflux disease without esophagitis: Secondary | ICD-10-CM | POA: Diagnosis not present

## 2021-10-11 DIAGNOSIS — E559 Vitamin D deficiency, unspecified: Secondary | ICD-10-CM | POA: Diagnosis not present

## 2021-10-11 DIAGNOSIS — E782 Mixed hyperlipidemia: Secondary | ICD-10-CM | POA: Diagnosis not present

## 2021-10-11 DIAGNOSIS — N3281 Overactive bladder: Secondary | ICD-10-CM | POA: Diagnosis not present

## 2021-11-01 DIAGNOSIS — I69898 Other sequelae of other cerebrovascular disease: Secondary | ICD-10-CM | POA: Diagnosis not present

## 2021-11-01 DIAGNOSIS — F01B3 Vascular dementia, moderate, with mood disturbance: Secondary | ICD-10-CM | POA: Diagnosis not present

## 2021-11-08 DIAGNOSIS — F01B3 Vascular dementia, moderate, with mood disturbance: Secondary | ICD-10-CM | POA: Diagnosis not present

## 2021-11-08 DIAGNOSIS — N3281 Overactive bladder: Secondary | ICD-10-CM | POA: Diagnosis not present

## 2021-11-08 DIAGNOSIS — E782 Mixed hyperlipidemia: Secondary | ICD-10-CM | POA: Diagnosis not present

## 2021-11-08 DIAGNOSIS — K219 Gastro-esophageal reflux disease without esophagitis: Secondary | ICD-10-CM | POA: Diagnosis not present

## 2021-11-08 DIAGNOSIS — E559 Vitamin D deficiency, unspecified: Secondary | ICD-10-CM | POA: Diagnosis not present

## 2021-11-09 DIAGNOSIS — W01198A Fall on same level from slipping, tripping and stumbling with subsequent striking against other object, initial encounter: Secondary | ICD-10-CM | POA: Diagnosis not present

## 2021-11-09 DIAGNOSIS — M25511 Pain in right shoulder: Secondary | ICD-10-CM | POA: Diagnosis not present

## 2021-11-09 DIAGNOSIS — M79601 Pain in right arm: Secondary | ICD-10-CM | POA: Diagnosis not present

## 2021-11-09 DIAGNOSIS — Z79899 Other long term (current) drug therapy: Secondary | ICD-10-CM | POA: Diagnosis not present

## 2021-11-09 DIAGNOSIS — I10 Essential (primary) hypertension: Secondary | ICD-10-CM | POA: Diagnosis not present

## 2021-11-09 DIAGNOSIS — W19XXXA Unspecified fall, initial encounter: Secondary | ICD-10-CM | POA: Diagnosis not present

## 2021-12-05 DIAGNOSIS — E782 Mixed hyperlipidemia: Secondary | ICD-10-CM | POA: Diagnosis not present

## 2021-12-05 DIAGNOSIS — I10 Essential (primary) hypertension: Secondary | ICD-10-CM | POA: Diagnosis not present

## 2021-12-06 DIAGNOSIS — I69898 Other sequelae of other cerebrovascular disease: Secondary | ICD-10-CM | POA: Diagnosis not present

## 2021-12-06 DIAGNOSIS — F01B3 Vascular dementia, moderate, with mood disturbance: Secondary | ICD-10-CM | POA: Diagnosis not present

## 2021-12-07 DIAGNOSIS — F01B3 Vascular dementia, moderate, with mood disturbance: Secondary | ICD-10-CM | POA: Diagnosis not present

## 2021-12-07 DIAGNOSIS — E782 Mixed hyperlipidemia: Secondary | ICD-10-CM | POA: Diagnosis not present

## 2021-12-07 DIAGNOSIS — Z79899 Other long term (current) drug therapy: Secondary | ICD-10-CM | POA: Diagnosis not present

## 2021-12-07 DIAGNOSIS — N3281 Overactive bladder: Secondary | ICD-10-CM | POA: Diagnosis not present

## 2022-01-01 DIAGNOSIS — H401132 Primary open-angle glaucoma, bilateral, moderate stage: Secondary | ICD-10-CM | POA: Diagnosis not present

## 2022-01-01 DIAGNOSIS — H527 Unspecified disorder of refraction: Secondary | ICD-10-CM | POA: Diagnosis not present

## 2022-01-01 DIAGNOSIS — Z961 Presence of intraocular lens: Secondary | ICD-10-CM | POA: Diagnosis not present

## 2022-01-01 DIAGNOSIS — H35033 Hypertensive retinopathy, bilateral: Secondary | ICD-10-CM | POA: Diagnosis not present

## 2022-01-01 DIAGNOSIS — H353131 Nonexudative age-related macular degeneration, bilateral, early dry stage: Secondary | ICD-10-CM | POA: Diagnosis not present

## 2022-01-01 DIAGNOSIS — H11002 Unspecified pterygium of left eye: Secondary | ICD-10-CM | POA: Diagnosis not present

## 2022-01-01 DIAGNOSIS — H43813 Vitreous degeneration, bilateral: Secondary | ICD-10-CM | POA: Diagnosis not present

## 2022-01-04 DIAGNOSIS — M150 Primary generalized (osteo)arthritis: Secondary | ICD-10-CM | POA: Diagnosis not present

## 2022-01-04 DIAGNOSIS — F01B3 Vascular dementia, moderate, with mood disturbance: Secondary | ICD-10-CM | POA: Diagnosis not present

## 2022-01-04 DIAGNOSIS — N1831 Chronic kidney disease, stage 3a: Secondary | ICD-10-CM | POA: Diagnosis not present

## 2022-01-04 DIAGNOSIS — I129 Hypertensive chronic kidney disease with stage 1 through stage 4 chronic kidney disease, or unspecified chronic kidney disease: Secondary | ICD-10-CM | POA: Diagnosis not present

## 2022-01-30 DIAGNOSIS — G629 Polyneuropathy, unspecified: Secondary | ICD-10-CM | POA: Diagnosis not present

## 2022-02-01 DIAGNOSIS — Z7901 Long term (current) use of anticoagulants: Secondary | ICD-10-CM | POA: Diagnosis not present

## 2022-02-01 DIAGNOSIS — Z79899 Other long term (current) drug therapy: Secondary | ICD-10-CM | POA: Diagnosis not present

## 2022-02-01 DIAGNOSIS — N1831 Chronic kidney disease, stage 3a: Secondary | ICD-10-CM | POA: Diagnosis not present

## 2022-02-01 DIAGNOSIS — I129 Hypertensive chronic kidney disease with stage 1 through stage 4 chronic kidney disease, or unspecified chronic kidney disease: Secondary | ICD-10-CM | POA: Diagnosis not present

## 2022-02-01 DIAGNOSIS — I4891 Unspecified atrial fibrillation: Secondary | ICD-10-CM | POA: Diagnosis not present

## 2022-02-01 DIAGNOSIS — F01B3 Vascular dementia, moderate, with mood disturbance: Secondary | ICD-10-CM | POA: Diagnosis not present

## 2022-02-14 DIAGNOSIS — I4819 Other persistent atrial fibrillation: Secondary | ICD-10-CM | POA: Diagnosis not present

## 2022-02-14 DIAGNOSIS — I10 Essential (primary) hypertension: Secondary | ICD-10-CM | POA: Diagnosis not present

## 2022-02-14 DIAGNOSIS — M25562 Pain in left knee: Secondary | ICD-10-CM | POA: Diagnosis not present

## 2022-03-21 DIAGNOSIS — I6389 Other cerebral infarction: Secondary | ICD-10-CM | POA: Diagnosis not present

## 2022-03-21 DIAGNOSIS — I4819 Other persistent atrial fibrillation: Secondary | ICD-10-CM | POA: Diagnosis not present

## 2022-03-21 DIAGNOSIS — I10 Essential (primary) hypertension: Secondary | ICD-10-CM | POA: Diagnosis not present

## 2022-03-21 DIAGNOSIS — Z7901 Long term (current) use of anticoagulants: Secondary | ICD-10-CM | POA: Diagnosis not present

## 2022-04-27 ENCOUNTER — Ambulatory Visit: Payer: Medicare Other | Admitting: Physician Assistant

## 2022-04-27 ENCOUNTER — Encounter: Payer: Self-pay | Admitting: Physician Assistant

## 2022-04-27 VITALS — BP 140/80 | HR 57 | Ht 65.0 in | Wt 186.8 lb

## 2022-04-27 DIAGNOSIS — I4821 Permanent atrial fibrillation: Secondary | ICD-10-CM

## 2022-04-27 DIAGNOSIS — E785 Hyperlipidemia, unspecified: Secondary | ICD-10-CM | POA: Diagnosis not present

## 2022-04-27 DIAGNOSIS — I10 Essential (primary) hypertension: Secondary | ICD-10-CM

## 2022-04-27 DIAGNOSIS — Z8673 Personal history of transient ischemic attack (TIA), and cerebral infarction without residual deficits: Secondary | ICD-10-CM

## 2022-04-27 NOTE — Patient Instructions (Addendum)
Medication Instructions:  No chhanges  *If you need a refill on your cardiac medications before your next appointment, please call your pharmacy*   Lab Work: Not needed    Testing/Procedures:  Not needed  Follow-Up: At [facility], you and your health needs are our priority.  As part of our continuing mission to provide you with exceptional heart care, we have created designated Provider Care Teams.  These Care Teams include your primary Cardiologist (physician) and Advanced Practice Providers (APPs -  Physician Assistants and Nurse Practitioners) who all work together to provide you with the care you need, when you need it.     Your next appointment:   6 month(s) Feb 2024  The format for your next appointment:   In Person  Provider:   Kirk Ruths, MD

## 2022-04-27 NOTE — Progress Notes (Unsigned)
Cardiology Office Note:    Date:  04/29/2022   ID:  Rebecca Cannon, DOB 27-Apr-1929, MRN [id number]  PCP:  [name], [name], [place] Prov[facility] Cardiologist:  Kirk Ruths, MD     Referring MD: Ramiro Harvest, PA-C   Chief Complaint  Patient presents with   Follow-up    Seen for Dr. [name]    History of Present Illness:    Rebecca Cannon is a 88 y.o. female with a hx of permanent atrial fibrillation, dyspnea, history of CVA, hypertension, hyperlipidemia and memory change.  Patient previously lived in [place] then [place].  She has a history of atrial fibrillation.  Echocardiogram in December 2020 demonstrated normal EF, mild RV enlargement, biatrial enlargement, mild aortic and mitral regurgitation.  Heart monitor in January 2021 showed atrial fibrillation with PVCs or aberrant conducted beat.  Due to bradycardia seen on the heart monitor, her metoprolol and timolol eyedrops were discontinued.  She was started on amlodipine for blood pressure control.  She was diagnosed with COVID-19 in May 2022.  I last saw the patient on 02/20/2021 at which time she was having several neurological concerns.  I obtained MRI of the brain which showed moderate size chronic cortical and subcortical infarct within the left frontoparietal lobe and the posterior left insula, additional small chronic cortical and subcortical infarcts within the medial left temporal lobe and bilateral occipital lobe, multiple small chronic infarcts within the bilateral cerebellar hemispheres, numerous chronic microhemorrhages.  Symptom was consistent with previous stroke.  I recommended follow-up with the neurology service. Patient was seen by Dr. Rexene Alberts of neurology service on 05/16/2021, she was not started on Aricept due to concern of precipitating worsening bradycardia.  It was recommended she start on Namenda, however she did not wish to.  She was last seen by Dr. [name] on 07/01/2021 at which  time she had burning pain in her left foot otherwise no obvious cardiac issue.  It appears she was started on Lasix 20 mg daily in September 2022.  She was admitted to the hospital here in November after a fall.  Cardiology service was called to evaluate bradycardia.  Patient was quite adamant to it was mechanical in nature.  Patient also described wishes to avoid pacemaker if at all possible.   Patient presents today for follow-up along with her son.  She denies any dizziness, blurred vision or feeling of passing out.  Given lack of symptoms associated with bradycardia, we will hold off on further work-up.  She has no lower extremity edema, orthopnea or PND.    Past Medical History:  Diagnosis Date   Aphasia    Atherosclerosis of aorta [facility])    Atrial fibrillation (HCC)    CVA (cerebral vascular accident) ([place])    CVD (cardiovascular disease)    Diverticulosis 07/24/2021   DJD (degenerative joint disease)    GERD (gastroesophageal reflux disease)    Hyperlipidemia    Hypertension    Incontinence    Macular degeneration    Memory change    Osteoporosis    Stroke ([place]) 02/2014    Past Surgical History:  Procedure Laterality Date   TONSILLECTOMY     Varicose veins      Current Medications: Current Meds  Medication Sig   amLODipine (NORVASC) 2.5 MG tablet Take 2.5 mg by mouth daily.   cholecalciferol (VITAMIN D) 25 MCG (1000 UNIT) tablet Take 1,000 Units by mouth daily.   folic acid (FOLVITE) 503 MCG tablet Take 400  mcg by mouth daily.   furosemide (LASIX) 20 MG tablet Take 1 tablet (20 mg total) by mouth daily.   latanoprost (XALATAN) 0.005 % ophthalmic solution Place 1 drop into both eyes at bedtime.   losartan (COZAAR) 100 MG tablet TAKE 1 TABLET BY MOUTH  DAILY   Omega-3 Fatty Acids (FISH OIL PO) Take 1,200 mg by mouth daily.   omeprazole (PRILOSEC) 40 MG capsule Take 40 mg by mouth daily.   ondansetron (ZOFRAN) 4 MG tablet Take 4 mg by mouth every 12 (twelve) hours as  needed for nausea or vomiting.   QUEtiapine (SEROQUEL) 25 MG tablet Take 25 mg by mouth at bedtime.   sertraline (ZOLOFT) 50 MG tablet Take 75 mg by mouth in the morning.   simvastatin (ZOCOR) 40 MG tablet Take 40 mg by mouth at bedtime.   timolol (TIMOPTIC) 0.5 % ophthalmic solution Place 1 drop into both eyes 2 (two) times daily.   vitamin B-12 (CYANOCOBALAMIN) 1000 MCG tablet Take 1,000 mcg by mouth daily.   XARELTO 20 MG TABS tablet Take 1 tablet (20 mg total) by mouth daily with supper.     Allergies:   Amlodipine, Iodine, Cefprozil, Mirabegron, and Peppermint flavor   Social History   Socioeconomic History   Marital status: Divorced    Spouse name: Not on file   Number of children: 10   Years of education: Not on file   Highest education level: Not on file  Occupational History   Not on file  Tobacco Use   Smoking status: Never   Smokeless tobacco: Never  Vaping Use   Vaping Use: Never used  Substance and Sexual Activity   Alcohol use: Yes    Comment: Rare   Drug use: Never   Sexual activity: Not on file  Other Topics Concern   Not on file  Social History Narrative   Coffee 2 daily   Social Determinants of Health   Financial Resource Strain: Not on file  Food Insecurity: Not on file  Transportation Needs: Not on file  Physical Activity: Not on file  Stress: Not on file  Social Connections: Not on file     Family History: The patient's family history includes Stroke in her father. There is no history of Memory loss.  ROS:   Please see the history of present illness.     All other systems reviewed and are negative.  EKGs/Labs/Other Studies Reviewed:    The following studies were reviewed today:  Echo 07/23/2021  1. Left ventricular ejection fraction, by estimation, is 65 to 70%. The  left ventricle has normal function. The left ventricle has no regional  wall motion abnormalities. There is mild left ventricular hypertrophy.  Left ventricular diastolic  function  could not be evaluated.   2. Right ventricular systolic function is mildly reduced. The right  ventricular size is normal. There is normal pulmonary artery systolic  pressure. The estimated right ventricular systolic pressure is 85.0 mmHg.   3. Left atrial size was severely dilated.   4. Right atrial size was moderately dilated.   5. The mitral valve is abnormal. Mild mitral valve regurgitation.   6. The aortic valve is tricuspid. Aortic valve regurgitation is trivial.  Aortic valve sclerosis/calcification is present, without any evidence of  aortic stenosis.   7. The inferior vena cava is dilated in size with >50% respiratory  variability, suggesting right atrial pressure of 8 mmHg.   8. Rhythm strip during this exam demostrated Afib with slow ventricular  response.   Comparison(s): Changes from prior study are noted. 08/27/2019: LVEF  55-60%.   EKG:  EKG is ordered today.  The ekg ordered today demonstrates atrial fibrillation, heart rate 57 bpm.  Poor R wave progression in the anterior leads.  Incomplete left bundle branch block.  Single PVC.  Recent Labs: 05/16/2021: TSH 2.080 07/23/2021: BUN 12; Creatinine, Ser 0.83; Hemoglobin 13.1; Magnesium 2.0; Platelets 255; Potassium 3.4; Sodium 137  Recent Lipid Panel    Component Value Date/Time   CHOL 151 08/24/2019 1256   TRIG 143 08/24/2019 1256   HDL 61 08/24/2019 1256   CHOLHDL 2.5 08/24/2019 1256   LDLCALC 66 08/24/2019 1256     Risk Assessment/Calculations:           Physical Exam:    VS:  BP (!) 140/80   Pulse (!) 57   Ht '5\' 5"'$  (1.651 m)   Wt 186 lb 12.8 oz (84.7 kg)   SpO2 95%   BMI 31.09 kg/m        Wt Readings from Last 3 Encounters:  04/27/22 186 lb 12.8 oz (84.7 kg)  07/26/21 176 lb 2.4 oz (79.9 kg)  06/14/21 181 lb 12.8 oz (82.5 kg)     GEN:  Well nourished, well developed in no acute distress HEENT: Normal NECK: No JVD; No carotid bruits LYMPHATICS: No lymphadenopathy CARDIAC: RRR, no  murmurs, rubs, gallops RESPIRATORY:  Clear to auscultation without rales, wheezing or rhonchi  ABDOMEN: Soft, non-tender, non-distended MUSCULOSKELETAL:  No edema; No deformity  SKIN: Warm and dry NEUROLOGIC:  Alert and oriented x 3 PSYCHIATRIC:  Normal affect   ASSESSMENT:    1. Permanent atrial fibrillation ([place])   2. Primary hypertension   3. Hyperlipidemia LDL goal <70   4. H/O: CVA (cerebrovascular accident)    PLAN:    In order of problems listed above:  Permanent atrial fibrillation: Heart rate well controlled.  Hypertension: Blood pressure stable  Hyperlipidemia: On simvastatin  History of CVA: No recurrence.           Medication Adjustments/Labs and Tests Ordered: Current medicines are reviewed at length with the patient today.  Concerns regarding medicines are outlined above.  Orders Placed This Encounter  Procedures   EKG 12-Lead   No orders of the defined types were placed in this encounter.   Patient Instructions  Medication Instructions:  No chhanges  *If you need a refill on your cardiac medications before your next appointment, please call your pharmacy*   Lab Work: Not needed    Testing/Procedures:  Not needed  Follow-Up: At [facility], you and your health needs are our priority.  As part of our continuing mission to provide you with exceptional heart care, we have created designated Provider Care Teams.  These Care Teams include your primary Cardiologist (physician) and Advanced Practice Providers (APPs -  Physician Assistants and Nurse Practitioners) who all work together to provide you with the care you need, when you need it.     Your next appointment:   6 month(s) Feb 2024  The format for your next appointment:   In Person  Provider:   Kirk Ruths, MD       Signed, Almyra Deforest, [place]  04/29/2022 11:25 PM    [place]

## 2022-04-29 ENCOUNTER — Encounter: Payer: Self-pay | Admitting: Physician Assistant

## 2022-06-06 DIAGNOSIS — L821 Other seborrheic keratosis: Secondary | ICD-10-CM | POA: Diagnosis not present

## 2022-06-06 DIAGNOSIS — L304 Erythema intertrigo: Secondary | ICD-10-CM | POA: Diagnosis not present

## 2022-06-25 DIAGNOSIS — H401132 Primary open-angle glaucoma, bilateral, moderate stage: Secondary | ICD-10-CM | POA: Diagnosis not present

## 2022-06-25 DIAGNOSIS — H353131 Nonexudative age-related macular degeneration, bilateral, early dry stage: Secondary | ICD-10-CM | POA: Diagnosis not present

## 2022-06-25 DIAGNOSIS — H35033 Hypertensive retinopathy, bilateral: Secondary | ICD-10-CM | POA: Diagnosis not present

## 2022-06-25 DIAGNOSIS — H527 Unspecified disorder of refraction: Secondary | ICD-10-CM | POA: Diagnosis not present

## 2022-06-25 DIAGNOSIS — H43813 Vitreous degeneration, bilateral: Secondary | ICD-10-CM | POA: Diagnosis not present

## 2022-07-02 DIAGNOSIS — R911 Solitary pulmonary nodule: Secondary | ICD-10-CM | POA: Diagnosis not present

## 2022-07-02 DIAGNOSIS — Z23 Encounter for immunization: Secondary | ICD-10-CM | POA: Diagnosis not present

## 2022-07-02 DIAGNOSIS — Z0000 Encounter for general adult medical examination without abnormal findings: Secondary | ICD-10-CM | POA: Diagnosis not present

## 2022-07-02 DIAGNOSIS — Z8673 Personal history of transient ischemic attack (TIA), and cerebral infarction without residual deficits: Secondary | ICD-10-CM | POA: Diagnosis not present

## 2022-07-02 DIAGNOSIS — I10 Essential (primary) hypertension: Secondary | ICD-10-CM | POA: Diagnosis not present

## 2022-07-02 DIAGNOSIS — Z7409 Other reduced mobility: Secondary | ICD-10-CM | POA: Diagnosis not present

## 2022-07-02 DIAGNOSIS — I4891 Unspecified atrial fibrillation: Secondary | ICD-10-CM | POA: Diagnosis not present

## 2022-07-02 DIAGNOSIS — E782 Mixed hyperlipidemia: Secondary | ICD-10-CM | POA: Diagnosis not present

## 2022-07-02 DIAGNOSIS — I700 Atherosclerosis of aorta: Secondary | ICD-10-CM | POA: Diagnosis not present

## 2022-07-30 DIAGNOSIS — H401132 Primary open-angle glaucoma, bilateral, moderate stage: Secondary | ICD-10-CM | POA: Diagnosis not present

## 2022-10-02 DIAGNOSIS — I700 Atherosclerosis of aorta: Secondary | ICD-10-CM | POA: Diagnosis not present

## 2022-10-02 DIAGNOSIS — H35033 Hypertensive retinopathy, bilateral: Secondary | ICD-10-CM | POA: Diagnosis not present

## 2022-10-02 DIAGNOSIS — I10 Essential (primary) hypertension: Secondary | ICD-10-CM | POA: Diagnosis not present

## 2022-10-02 DIAGNOSIS — R350 Frequency of micturition: Secondary | ICD-10-CM | POA: Diagnosis not present

## 2022-10-02 DIAGNOSIS — I4891 Unspecified atrial fibrillation: Secondary | ICD-10-CM | POA: Diagnosis not present

## 2022-12-23 IMAGING — CT CT HEAD W/O CM
3 series · 15 of 47 positions shown, 18 images · non-contrast
Comparison: MRI brain dated March 04, 2021.

CLINICAL DATA: Recent fall.

EXAM:
CT HEAD WITHOUT CONTRAST
CT CERVICAL SPINE WITHOUT CONTRAST
TECHNIQUE: Multidetector CT imaging of the head and cervical spine was
performed following the standard protocol without intravenous
contrast. Multiplanar CT image reconstructions of the cervical spine
were also generated.

[Series 2: head 5.0 h30s · axial · 0.47mm/px · z∈[-506,-376]mm · 9 of 32 slices shown, 12 images]
[im 3/32  brain]
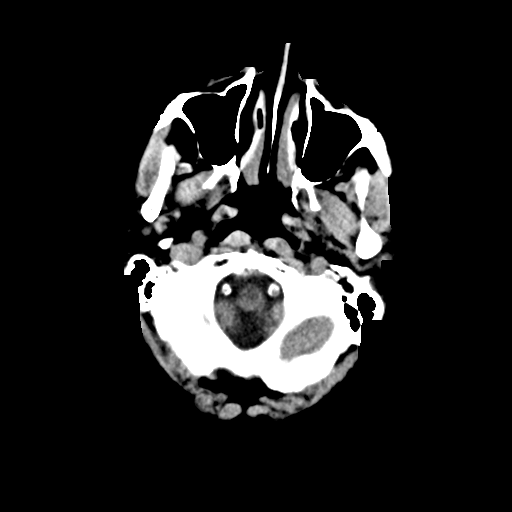
[im 3/32  bone]
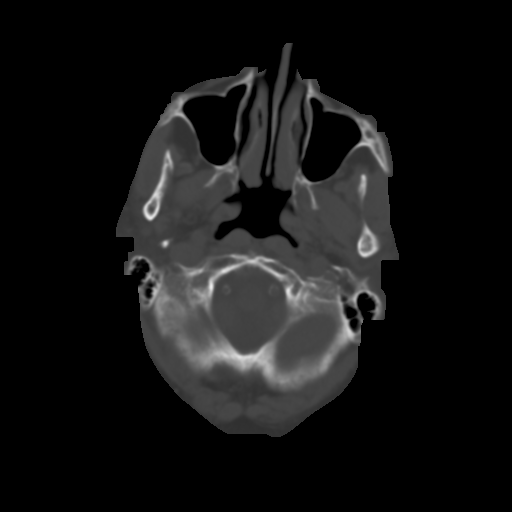
[im 6/32  brain]
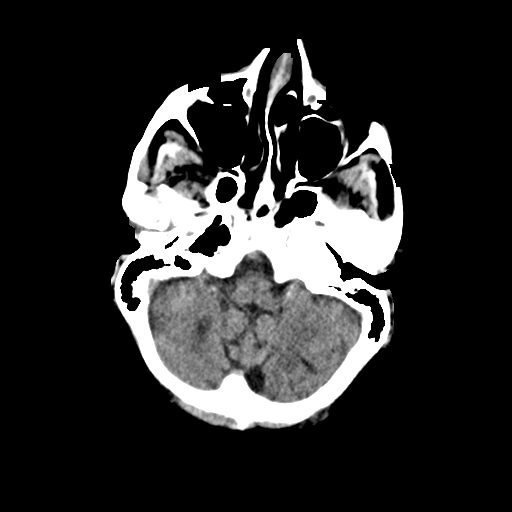
[im 9/32  brain]
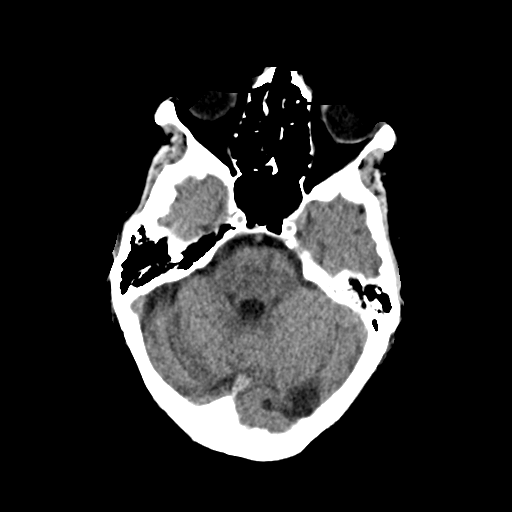
[im 12/32  brain]
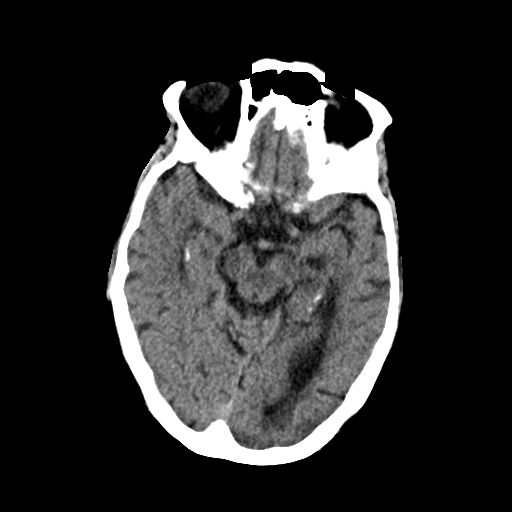
[im 17/32  brain]
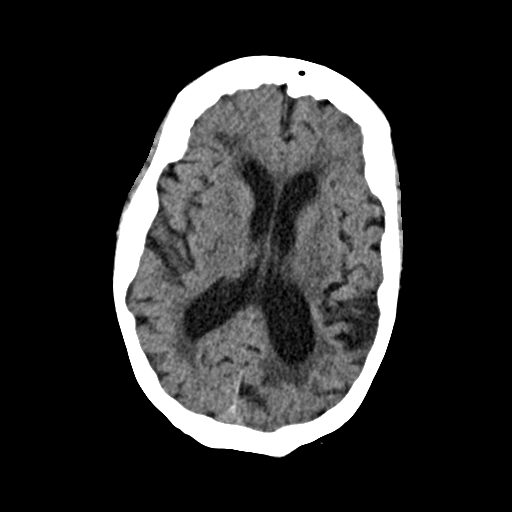
[im 17/32  bone]
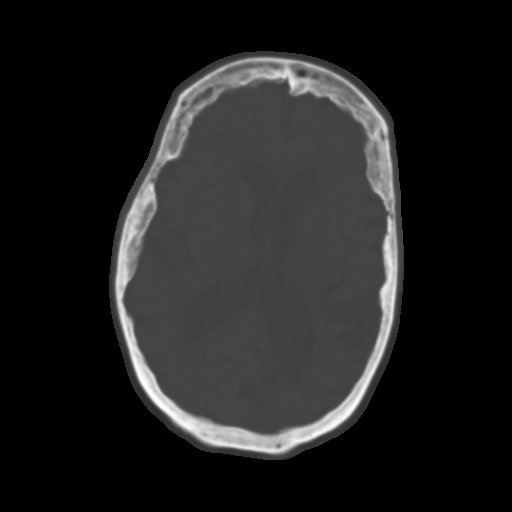
[im 20/32  brain]
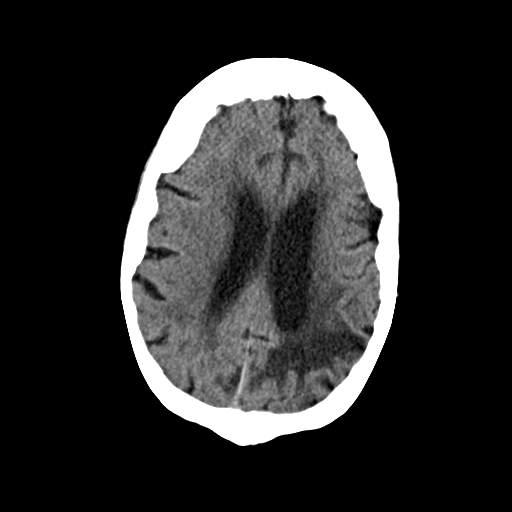
[im 23/32  brain]
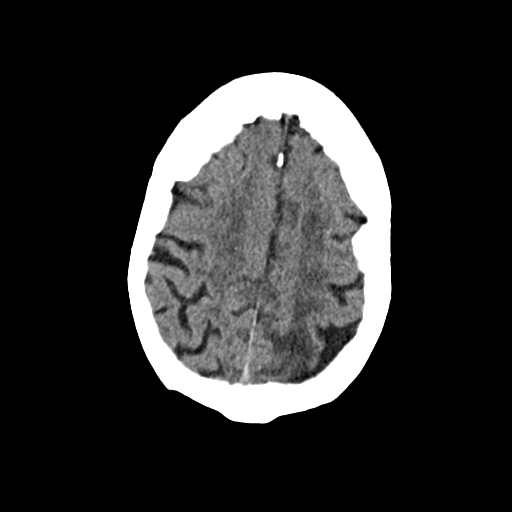
[im 26/32  brain]
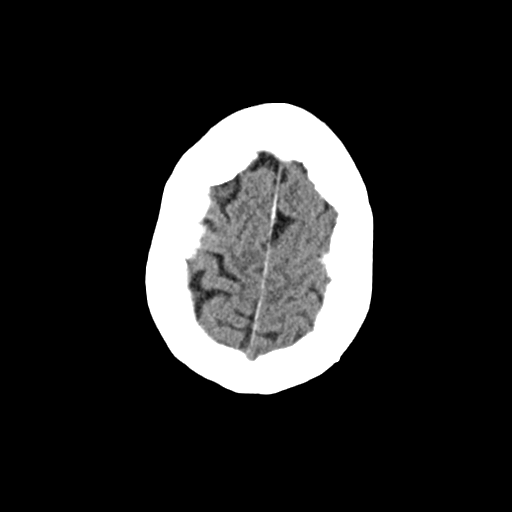
[im 29/32  brain]
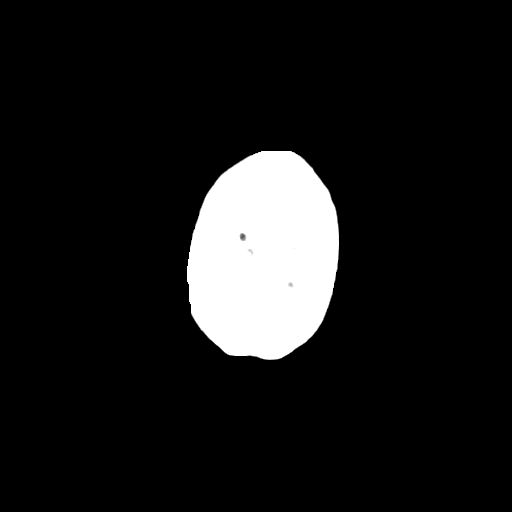
[im 29/32  bone]
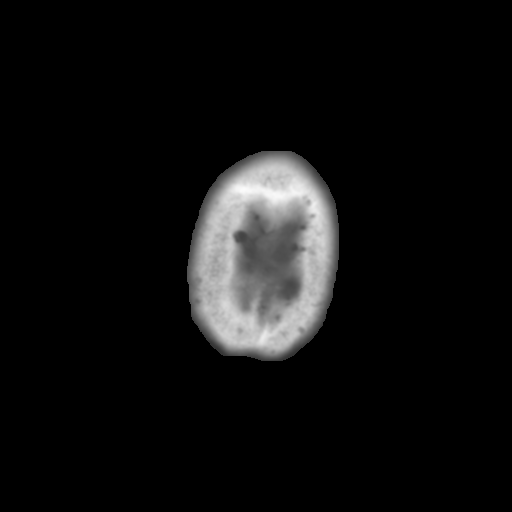

[Series 4: head 3.0 mpr cor · coronal · 0.32mm/px · 3 of 68 slices shown]
[im 23/68  brain]
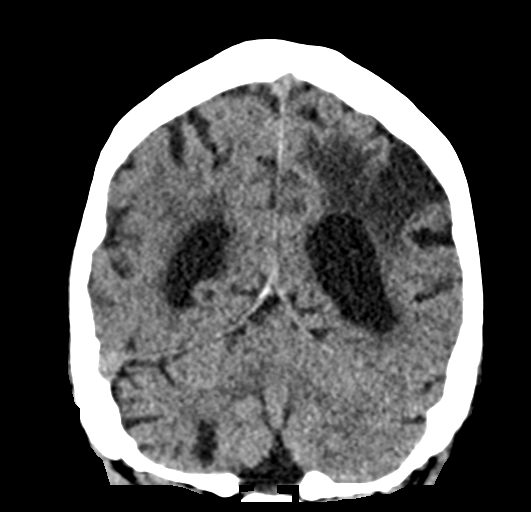
[im 30/68  brain]
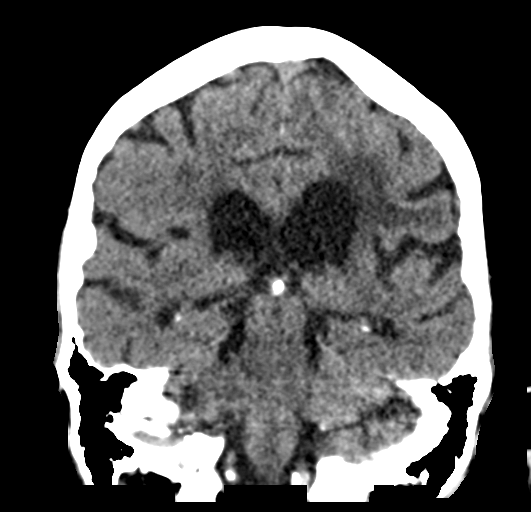
[im 38/68  brain]
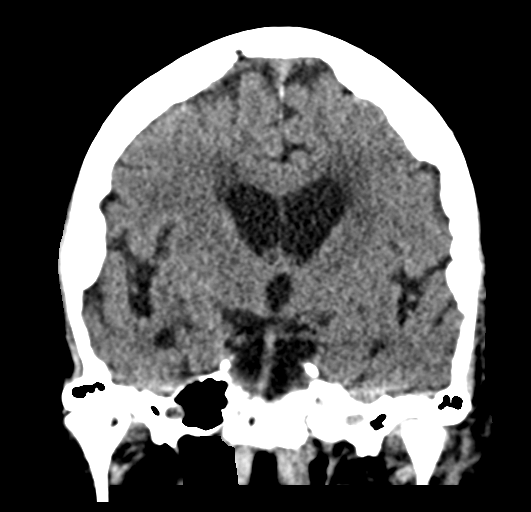

[Series 5: head 3.0 mpr sag · sagittal · 0.30mm/px · 3 of 51 slices shown]
[im 17/51  brain]
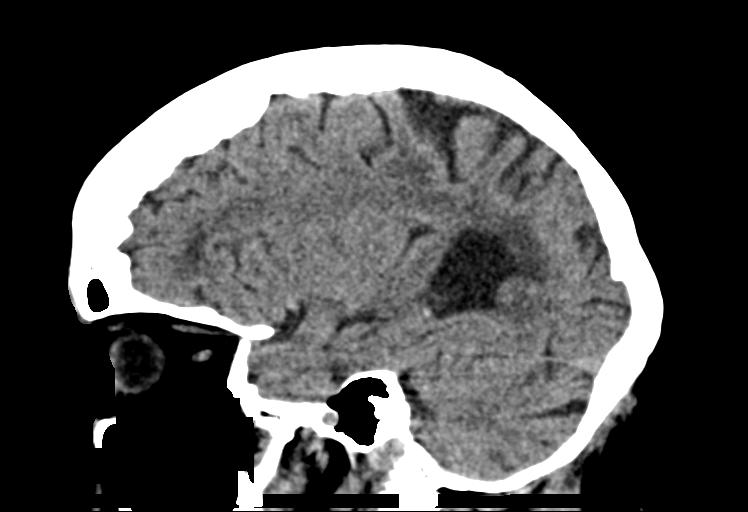
[im 26/51  brain]
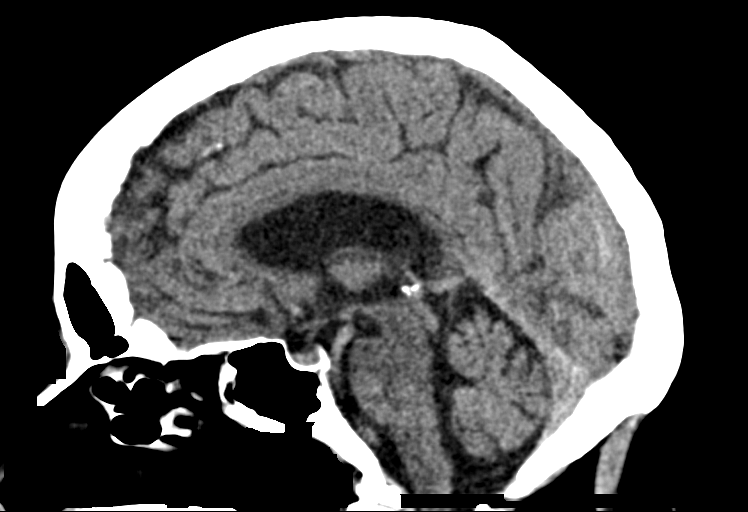
[im 34/51  brain]
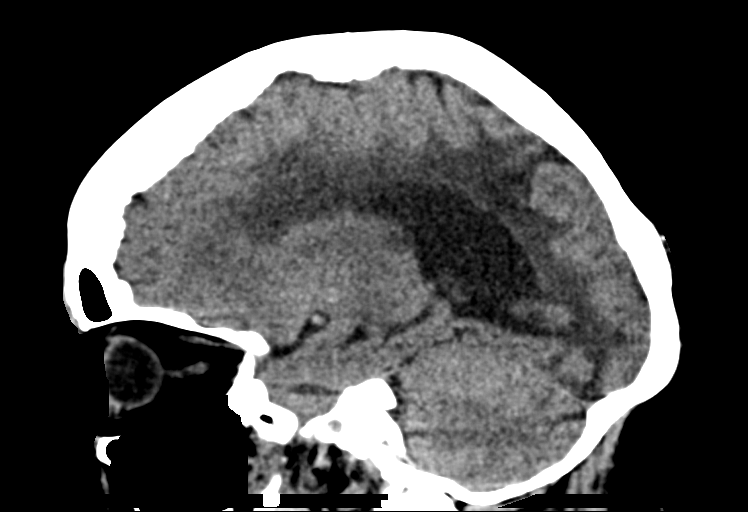

[15 of 47 positions shown; findings below may reference images not displayed]

FINDINGS: CT HEAD FINDINGS

Brain: No evidence of acute infarction, hemorrhage, hydrocephalus,
extra-axial collection or mass lesion/mass effect. Old left
parieto-occipital lobe infarct again noted. Old lacunar infarcts in
the left and right cerebellum. Stable atrophy and chronic
microvascular ischemic changes.

Vascular: Calcified atherosclerosis at the skull base. No hyperdense
vessel.

Skull: Negative for fracture or focal lesion.

Sinuses/Orbits: No acute finding.

Other: None.

CT CERVICAL SPINE FINDINGS

Alignment: No traumatic malalignment. Straightening of the normal
cervical lordosis. Trace anterolisthesis at C2-C3 and C7-T1.

Skull base and vertebrae: No acute fracture. No primary bone lesion
or focal pathologic process.

Soft tissues and spinal canal: No prevertebral fluid or swelling. No
visible canal hematoma.

Disc levels: Multilevel disc height loss, severe at C5-C6 and C6-C7.
Diffuse severe facet arthropathy throughout the cervical spine.
Prior ankylosis of the bilateral C3-C4 and C4-C5 facet joints.

Upper chest: Negative.

Other: None.
IMPRESSION: 1. No acute intracranial abnormality.  Multiple old infarcts.
2. No acute cervical spine fracture or traumatic malalignment.
Advanced multilevel cervical spondylosis.

## 2022-12-23 IMAGING — CT CT CERVICAL SPINE W/O CM
3 series · 15 of 27 positions shown, 18 images · non-contrast
Comparison: MRI brain dated March 04, 2021.

CLINICAL DATA: Recent fall.

EXAM:
CT HEAD WITHOUT CONTRAST
CT CERVICAL SPINE WITHOUT CONTRAST
TECHNIQUE: Multidetector CT imaging of the head and cervical spine was
performed following the standard protocol without intravenous
contrast. Multiplanar CT image reconstructions of the cervical spine
were also generated.

[Series 3: c_spine 2.0 i30s 3 · axial · 0.37mm/px · z∈[-592,-512]mm · 5 of 61 slices shown, 7 images]
[im 11/61  soft-tissue]
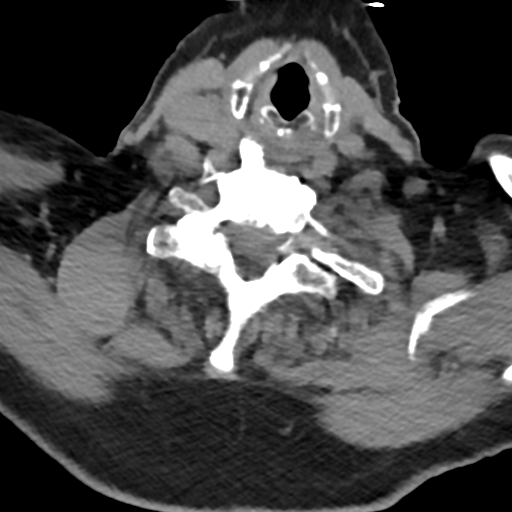
[im 11/61  bone]
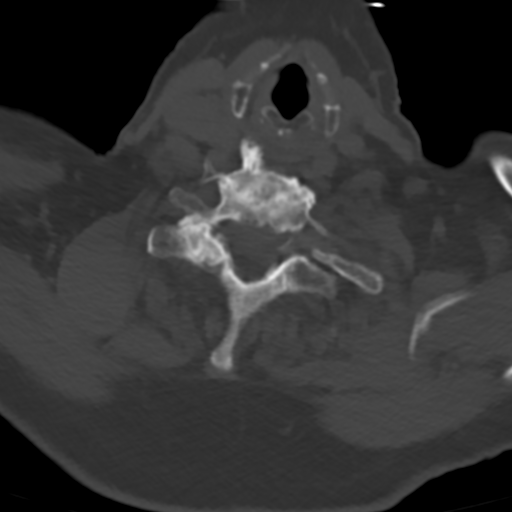
[im 21/61  bone]
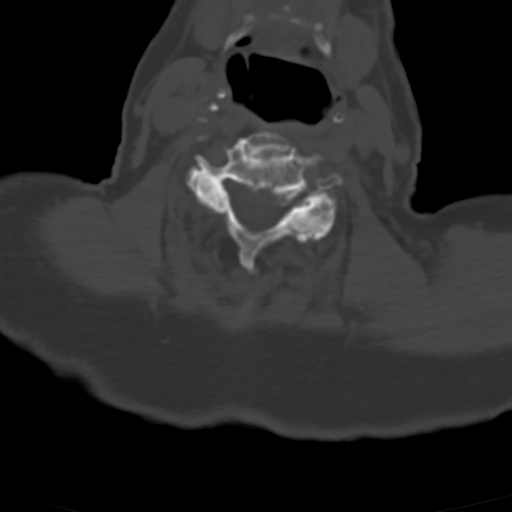
[im 31/61  bone]
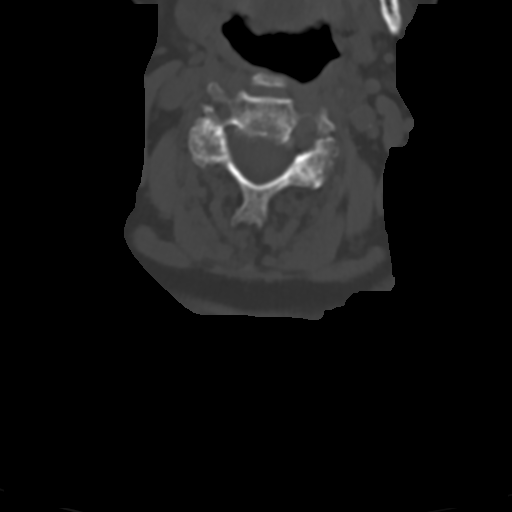
[im 41/61  bone]
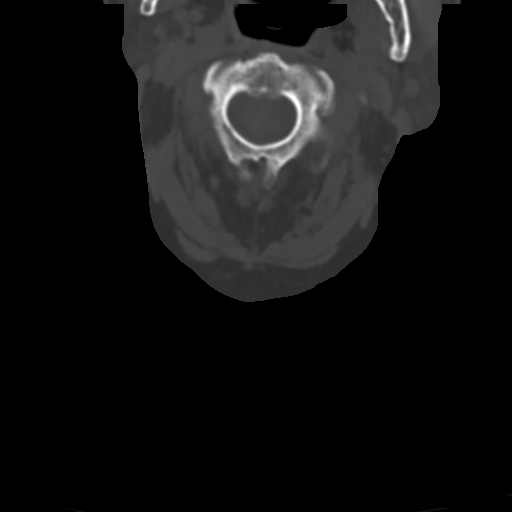
[im 51/61  soft-tissue]
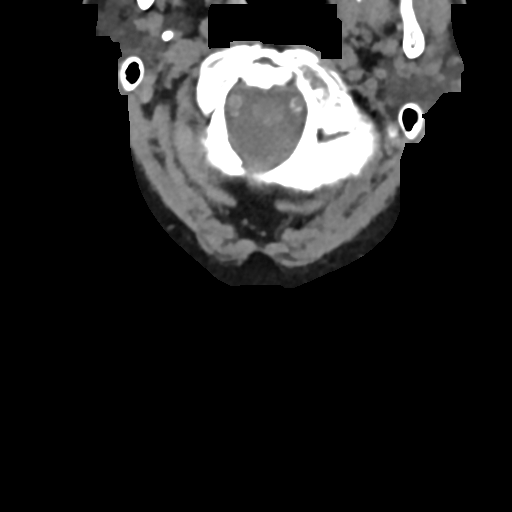
[im 51/61  bone]
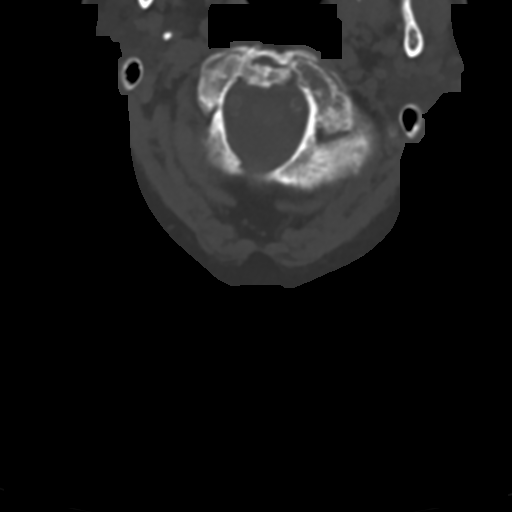

[Series 6: sagittals · sagittal · 0.28mm/px · 5 of 61 slices shown, 6 images]
[im 21/61  bone]
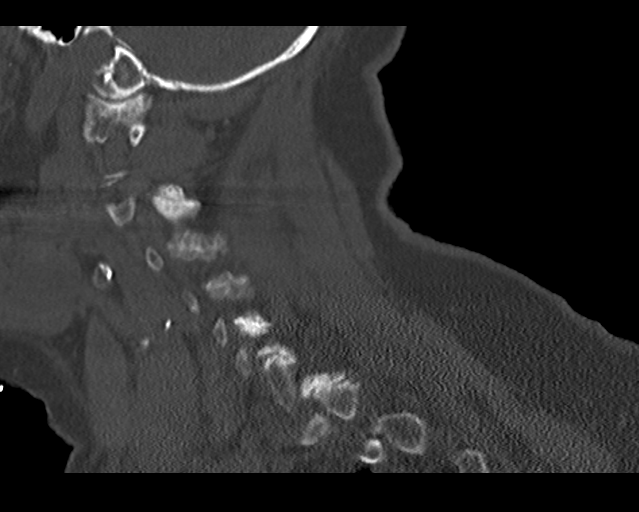
[im 26/61  bone]
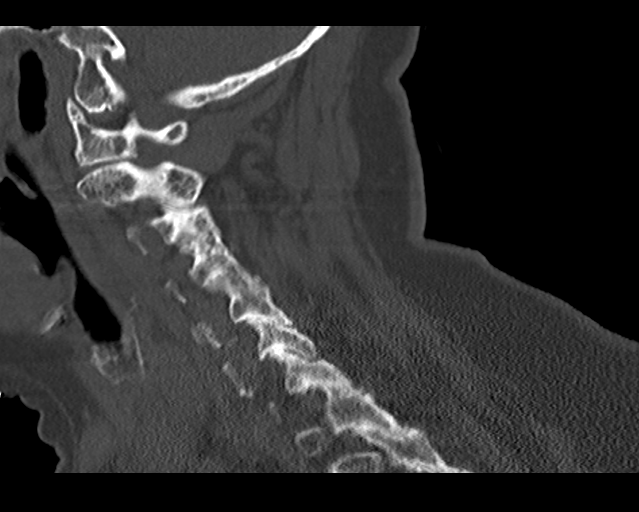
[im 31/61  soft-tissue]
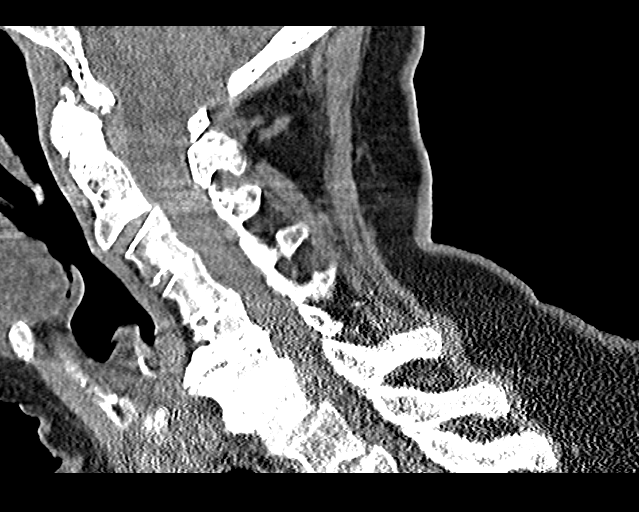
[im 31/61  bone]
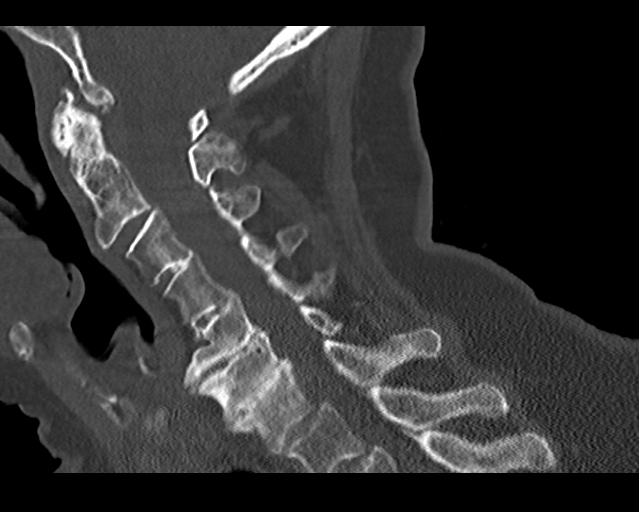
[im 36/61  bone]
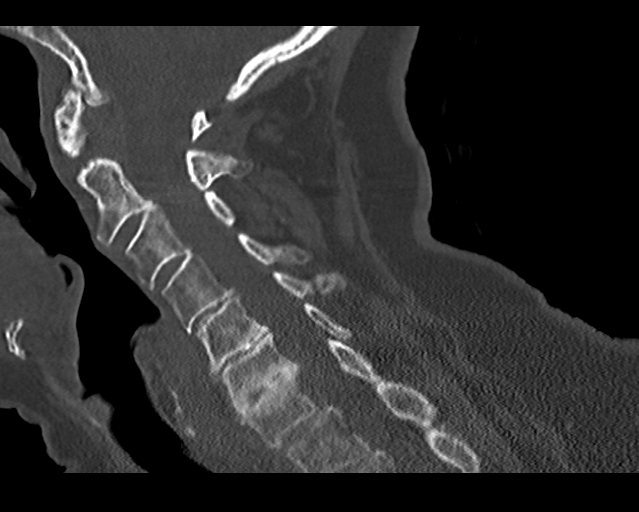
[im 41/61  bone]
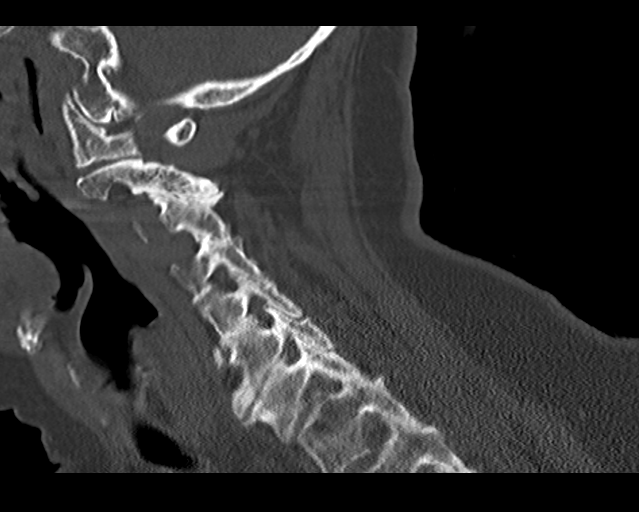

[Series 7: orthogonals · axial · 0.23mm/px · z∈[-619,-554]mm · 5 of 62 slices shown]
[im 11/62  bone]
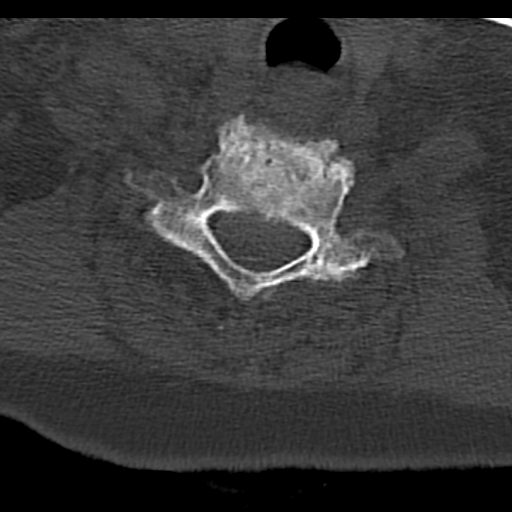
[im 21/62  bone]
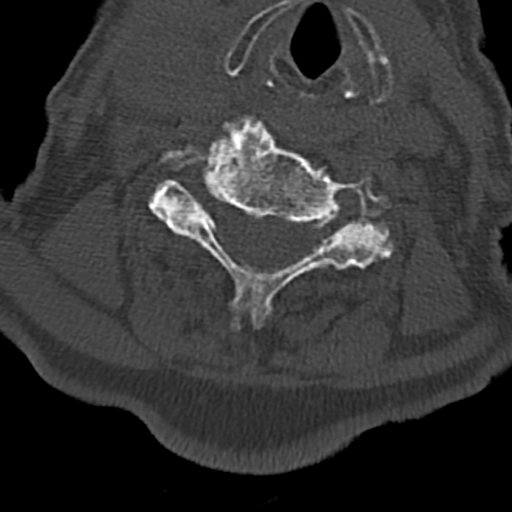
[im 31/62  bone]
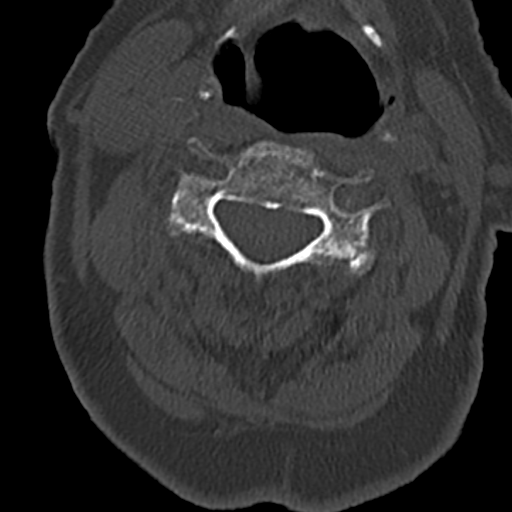
[im 41/62  bone]
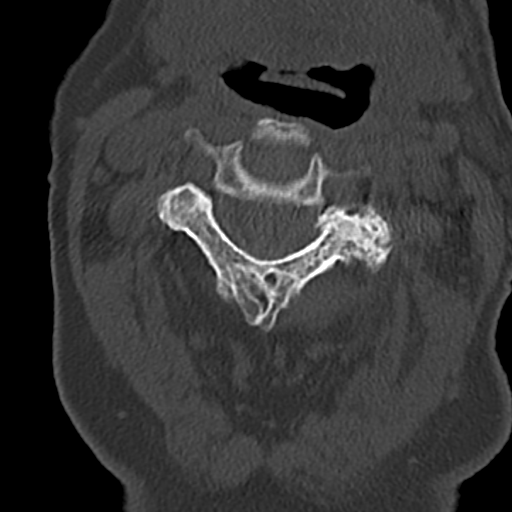
[im 51/62  bone]
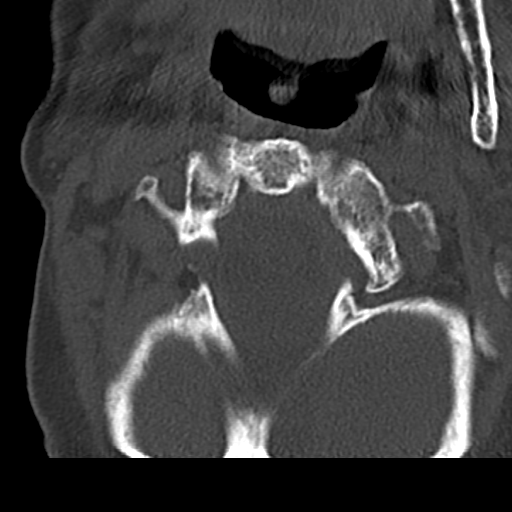

[15 of 27 positions shown; findings below may reference images not displayed]

FINDINGS: CT HEAD FINDINGS

Brain: No evidence of acute infarction, hemorrhage, hydrocephalus,
extra-axial collection or mass lesion/mass effect. Old left
parieto-occipital lobe infarct again noted. Old lacunar infarcts in
the left and right cerebellum. Stable atrophy and chronic
microvascular ischemic changes.

Vascular: Calcified atherosclerosis at the skull base. No hyperdense
vessel.

Skull: Negative for fracture or focal lesion.

Sinuses/Orbits: No acute finding.

Other: None.

CT CERVICAL SPINE FINDINGS

Alignment: No traumatic malalignment. Straightening of the normal
cervical lordosis. Trace anterolisthesis at C2-C3 and C7-T1.

Skull base and vertebrae: No acute fracture. No primary bone lesion
or focal pathologic process.

Soft tissues and spinal canal: No prevertebral fluid or swelling. No
visible canal hematoma.

Disc levels: Multilevel disc height loss, severe at C5-C6 and C6-C7.
Diffuse severe facet arthropathy throughout the cervical spine.
Prior ankylosis of the bilateral C3-C4 and C4-C5 facet joints.

Upper chest: Negative.

Other: None.
IMPRESSION: 1. No acute intracranial abnormality.  Multiple old infarcts.
2. No acute cervical spine fracture or traumatic malalignment.
Advanced multilevel cervical spondylosis.

## 2022-12-23 IMAGING — CT CT T SPINE W/O CM
5 series · 14 of 33 positions shown, 16 images · IV contrast (agent unspecified)
Comparison: None.

CLINICAL DATA: Fall.

EXAM:
CT THORACIC AND LUMBAR SPINE WITHOUT CONTRAST
TECHNIQUE: Multiplanar CT images of the thoracic and lumbar spine were
reconstructed from contemporary CT of the Chest, Abdomen, and Pelvis
CONTRAST:  None.

[Series 3: t spine soft · axial · 0.32mm/px · z∈[+948,+1008]mm · 2 of 62 slices shown]
[im 13/62  soft-tissue]
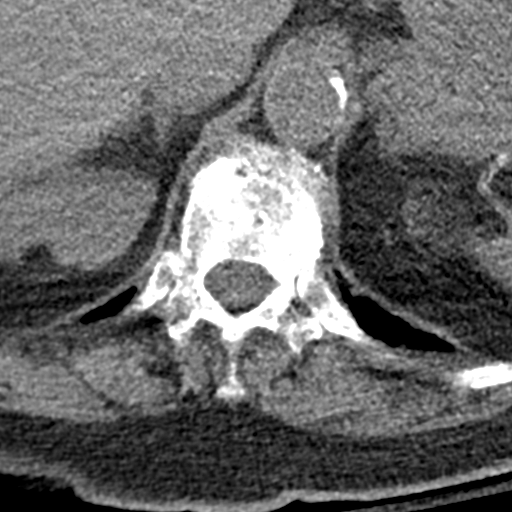
[im 25/62  soft-tissue]
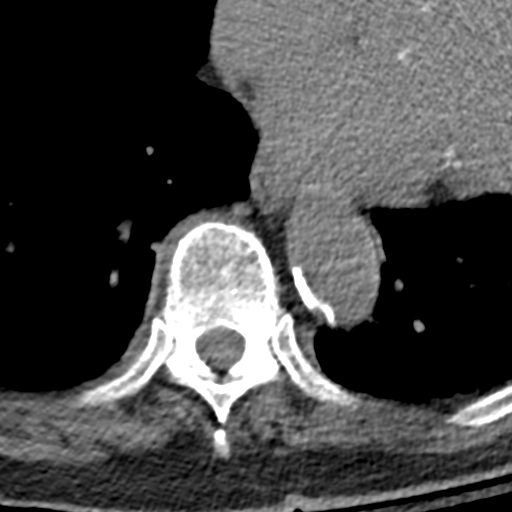

[Series 4: t spine cor · coronal · 0.35mm/px · 3 of 33 slices shown]
[im 7/33  bone]
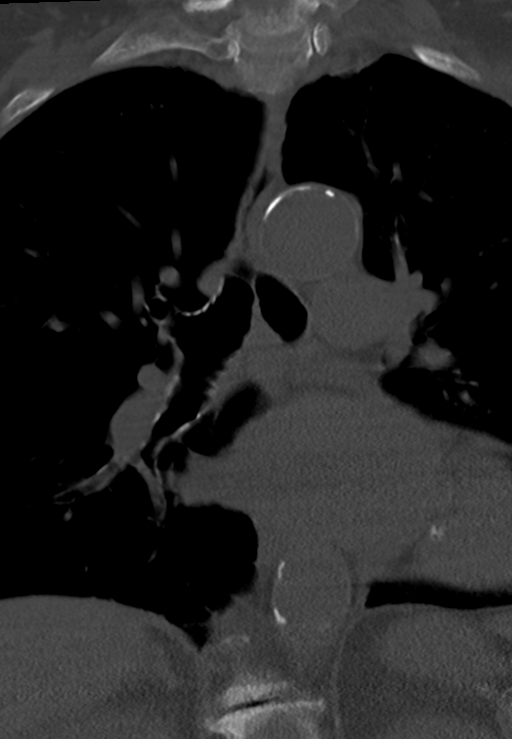
[im 13/33  bone]
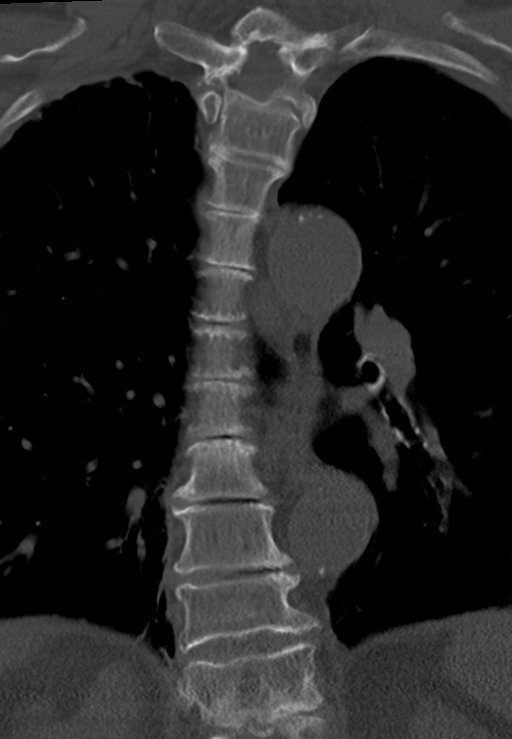
[im 20/33  bone]
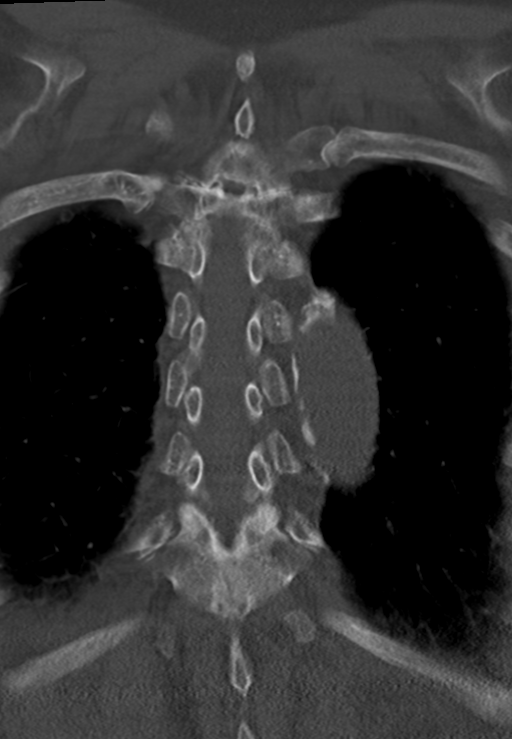

[Series 5: t spine sag · sagittal · 0.37mm/px · 5 of 39 slices shown, 6 images]
[im 13/39  bone]
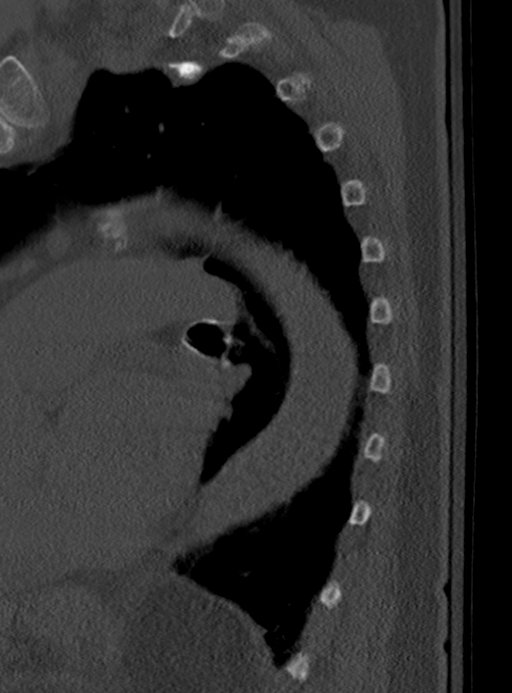
[im 16/39  bone]
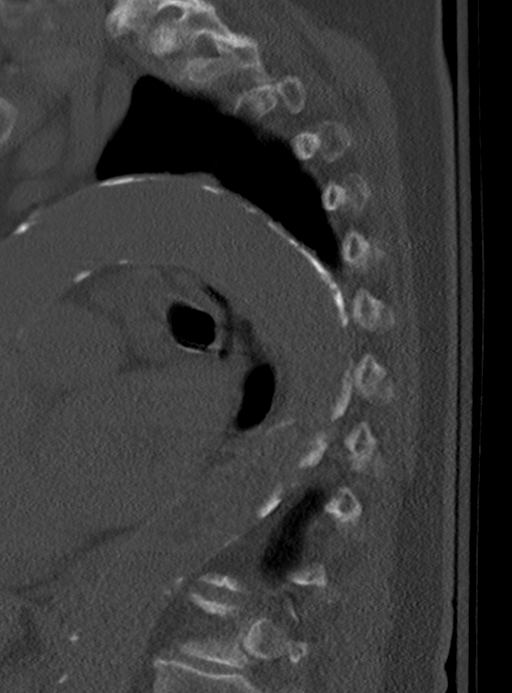
[im 20/39  soft-tissue]
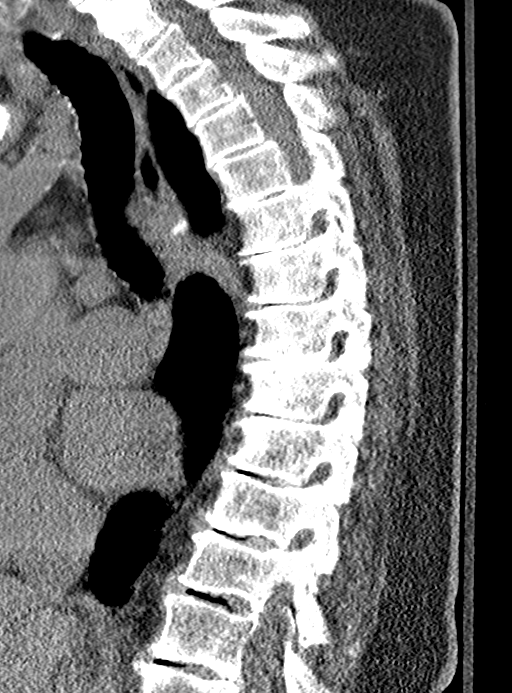
[im 20/39  bone]
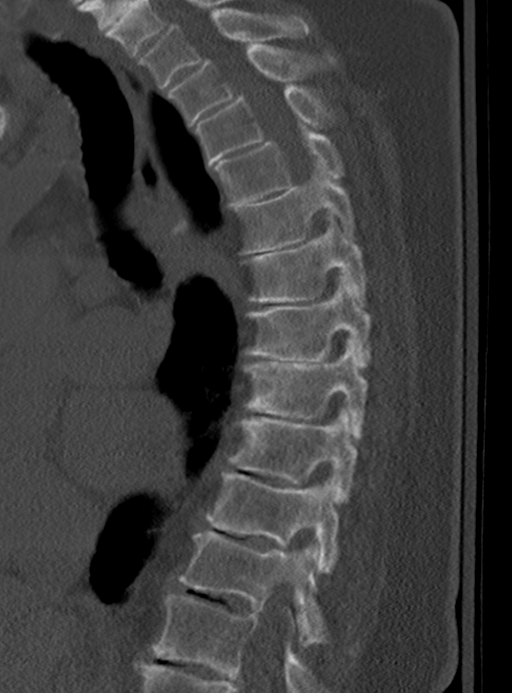
[im 23/39  bone]
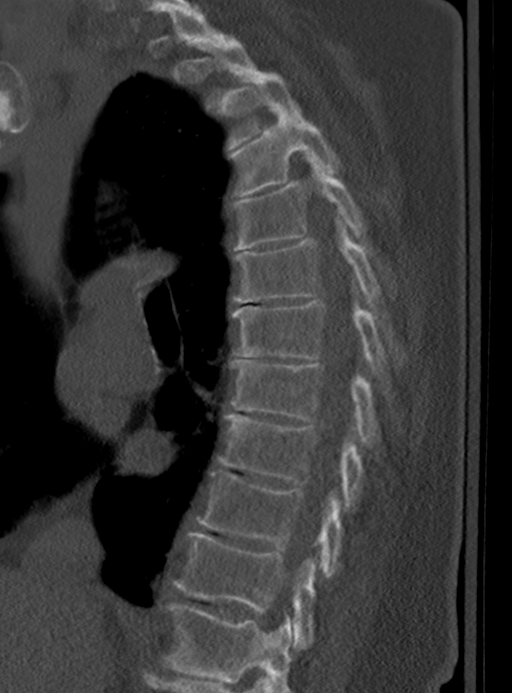
[im 26/39  bone]
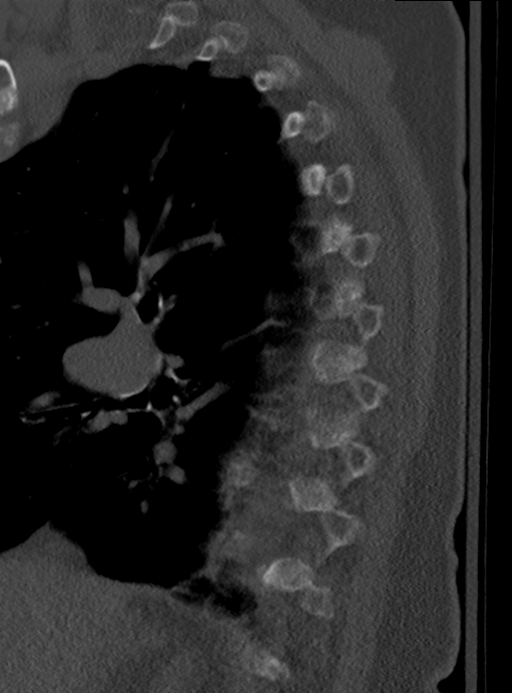

[Series 6: t spine orthagonal i · axial · 0.34mm/px · z∈[+1019,+1103]mm · 2 of 51 slices shown, 3 images]
[im 17/51  soft-tissue]
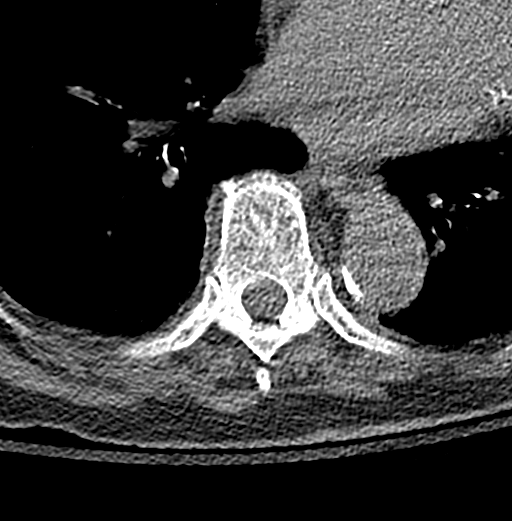
[im 17/51  bone]
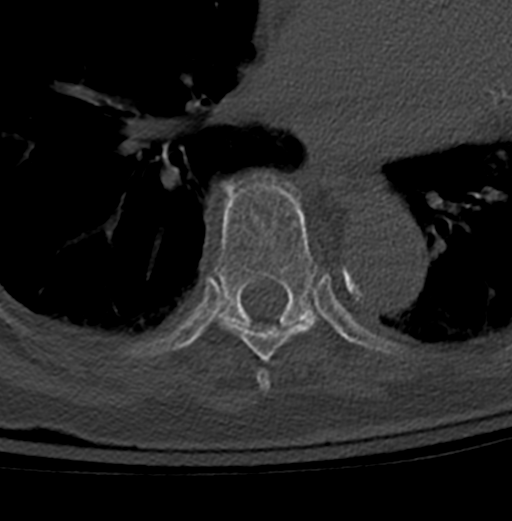
[im 34/51  bone]
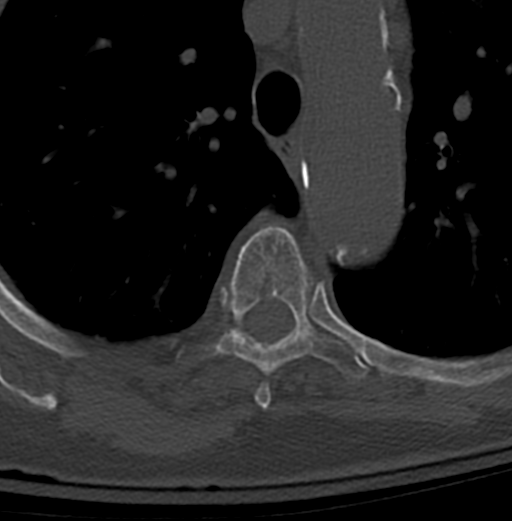

[Series 7: t spine orthagonal ii · axial · 0.34mm/px · z∈[+1043,+1117]mm · 2 of 47 slices shown]
[im 16/47  bone]
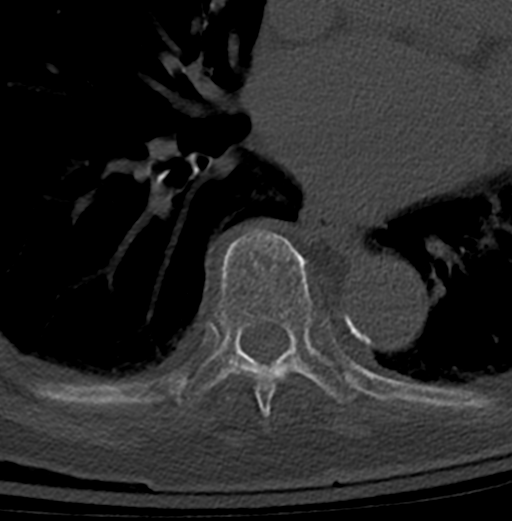
[im 31/47  bone]
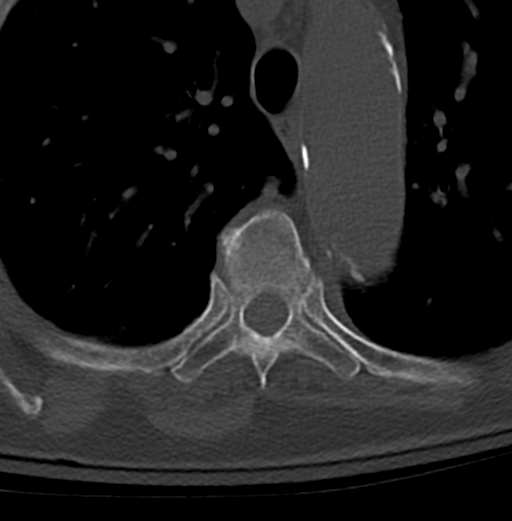

[14 of 33 positions shown; findings below may reference images not displayed]

FINDINGS: CT THORACIC SPINE FINDINGS

Alignment: Mild dextroscoliosis. Trace stepwise anterolisthesis from
T1-T2 through T3-T4. No traumatic malalignment.

Vertebrae: No acute fracture or focal pathologic process.

Paraspinal and other soft tissues: Please see separate CT chest,
abdomen, and pelvis report from same day.

Disc levels: Mild multilevel disc height loss, anterior endplate
spurring, and facet arthropathy.

CT LUMBAR SPINE FINDINGS

Segmentation: 5 lumbar type vertebrae.

Alignment: Trace anterolisthesis at L3-L4. 7 mm anterolisthesis at
L4-L5.

Vertebrae: No acute fracture or focal pathologic process. Chronic
moderate L4 compression deformity.

Paraspinal and other soft tissues: Please see separate CT chest,
abdomen, and pelvis report from same day.

Disc levels: Multilevel disc height loss, moderate to severe at
L5-S1. Mild-to-moderate lumbar facet arthropathy. At least moderate
spinal canal stenosis at L4-L5. Moderate right neuroforaminal
stenosis at L2-L3.
IMPRESSION: 1. No acute fracture of the thoracic or lumbar spine.
2. Chronic moderate L4 compression deformity.

## 2023-01-09 DIAGNOSIS — H353131 Nonexudative age-related macular degeneration, bilateral, early dry stage: Secondary | ICD-10-CM | POA: Diagnosis not present

## 2023-01-09 DIAGNOSIS — H11002 Unspecified pterygium of left eye: Secondary | ICD-10-CM | POA: Diagnosis not present

## 2023-01-09 DIAGNOSIS — H43813 Vitreous degeneration, bilateral: Secondary | ICD-10-CM | POA: Diagnosis not present

## 2023-01-09 DIAGNOSIS — H401132 Primary open-angle glaucoma, bilateral, moderate stage: Secondary | ICD-10-CM | POA: Diagnosis not present

## 2023-01-09 DIAGNOSIS — H526 Other disorders of refraction: Secondary | ICD-10-CM | POA: Diagnosis not present

## 2023-01-18 ENCOUNTER — Emergency Department (HOSPITAL_BASED_OUTPATIENT_CLINIC_OR_DEPARTMENT_OTHER)
Admission: EM | Admit: 2023-01-18 | Discharge: 2023-01-18 | Disposition: A | Discharge status: Skilled Nursing Facility | Payer: Medicare Other | Attending: Emergency Medicine | Admitting: Emergency Medicine

## 2023-01-18 ENCOUNTER — Encounter (HOSPITAL_BASED_OUTPATIENT_CLINIC_OR_DEPARTMENT_OTHER): Payer: Self-pay

## 2023-01-18 ENCOUNTER — Emergency Department (HOSPITAL_BASED_OUTPATIENT_CLINIC_OR_DEPARTMENT_OTHER): Payer: Medicare Other

## 2023-01-18 ENCOUNTER — Other Ambulatory Visit: Payer: Self-pay

## 2023-01-18 DIAGNOSIS — R0789 Other chest pain: Secondary | ICD-10-CM | POA: Diagnosis not present

## 2023-01-18 DIAGNOSIS — J189 Pneumonia, unspecified organism: Secondary | ICD-10-CM | POA: Diagnosis not present

## 2023-01-18 DIAGNOSIS — I10 Essential (primary) hypertension: Secondary | ICD-10-CM | POA: Insufficient documentation

## 2023-01-18 DIAGNOSIS — Z79899 Other long term (current) drug therapy: Secondary | ICD-10-CM | POA: Diagnosis not present

## 2023-01-18 DIAGNOSIS — R079 Chest pain, unspecified: Secondary | ICD-10-CM | POA: Diagnosis not present

## 2023-01-18 DIAGNOSIS — Z7901 Long term (current) use of anticoagulants: Secondary | ICD-10-CM | POA: Insufficient documentation

## 2023-01-18 DIAGNOSIS — R0602 Shortness of breath: Secondary | ICD-10-CM | POA: Diagnosis not present

## 2023-01-18 LAB — BASIC METABOLIC PANEL
Anion gap: 10 (ref 5–15)
BUN: 14 mg/dL (ref 8–23)
CO2: 24 mmol/L (ref 22–32)
Calcium: 8.8 mg/dL — ABNORMAL LOW (ref 8.9–10.3)
Chloride: 100 mmol/L (ref 98–111)
Creatinine, Ser: 0.69 mg/dL (ref 0.44–1.00)
GFR, Estimated: >60 mL/min (ref >60)
Glucose, Bld: 114 mg/dL — ABNORMAL HIGH (ref 70–99)
Potassium: 3.7 mmol/L (ref 3.5–5.1)
Sodium: 134 mmol/L — ABNORMAL LOW (ref 135–145)

## 2023-01-18 LAB — CBC
HCT: 40.5 % (ref 36.0–46.0)
Hemoglobin: 13.5 g/dL (ref 12.0–15.0)
MCH: 30.7 pg (ref 26.0–34.0)
MCHC: 33.3 g/dL (ref 30.0–36.0)
MCV: 92 fL (ref 80.0–100.0)
Platelets: 272 10*3/uL (ref 150–400)
RBC: 4.4 MIL/uL (ref 3.87–5.11)
RDW: 12.9 % (ref 11.5–15.5)
WBC: 9.8 10*3/uL (ref 4.0–10.5)
nRBC: 0 % (ref 0.0–0.2)

## 2023-01-18 LAB — TROPONIN I (HIGH SENSITIVITY): Troponin I (High Sensitivity): 10 ng/L (ref <18)

## 2023-01-18 MED ORDER — DOXYCYCLINE HYCLATE 100 MG PO CAPS: 100.0000 mg | ORAL_CAPSULE | Freq: Two times a day (BID) | ORAL | 0 refills | Status: AC

## 2023-01-18 MED ORDER — AEROCHAMBER PLUS FLO-VU MISC
1.0000 | Freq: Once | Status: AC
  Administered 2023-01-18: 1
  Filled 2023-01-18: qty 1

## 2023-01-18 MED ORDER — ALBUTEROL SULFATE HFA 108 (90 BASE) MCG/ACT IN AERS
2.0000 | INHALATION_SPRAY | RESPIRATORY_TRACT | Status: DC | PRN
  Administered 2023-01-18: 2 via RESPIRATORY_TRACT
  Filled 2023-01-18: qty 6.7

## 2023-01-18 NOTE — Discharge Instructions (Addendum)
Take the antibiotics to help with your likely pneumonia.  The inhaler may also help.  Return for worsening shortness of breath.

## 2023-01-18 NOTE — ED Notes (Signed)
IV start attempted x2, unsuccessful. Primary RN made aware.  

## 2023-01-18 NOTE — ED Notes (Signed)
RT walked with Rebecca Cannon around the nurses' station on room air.  Her O2 sat =90% and her HR=78.  She is slightly short of breath when walking.  RR=24 after walking.

## 2023-01-18 NOTE — ED Triage Notes (Signed)
Pt BIB family for L sided CP and "a lot of mucous in my throat". Pt reports intermittent SHOB.

## 2023-01-18 NOTE — ED Provider Notes (Signed)
[facility] Provider Note   CSN: [id number] Arrival date & time: 01/18/23  1546     History  Chief Complaint  Patient presents with   Chest Pain    Rebecca Cannon is a 89 y.o. female.   Chest Pain Patient presents with shortness of breath and cough.  Also left-sided chest pain.  Somewhat difficult to get history from.  Has a history of permanent A-fib and is on anticoagulation.  Sent from PCP.  Reportedly not hypoxic there.  Has had a cough with some mucus.  No swelling in her legs.  No definite sick contacts.    Past Medical History:  Diagnosis Date   Aphasia    Atherosclerosis of aorta [facility])    Atrial fibrillation (HCC)    CVA (cerebral vascular accident) (HCC)    CVD (cardiovascular disease)    Diverticulosis 07/24/2021   DJD (degenerative joint disease)    GERD (gastroesophageal reflux disease)    Hyperlipidemia    Hypertension    Incontinence    Macular degeneration    Memory change    Osteoporosis    Stroke (HCC) 02/2014    Home Medications Prior to Admission medications   Medication Sig Start Date End Date Taking? Authorizing Provider  doxycycline (VIBRAMYCIN) 100 MG capsule Take 1 capsule (100 mg total) by mouth 2 (two) times daily. 01/18/23  Yes Benjiman Core, MD  amLODipine (NORVASC) 2.5 MG tablet Take 2.5 mg by mouth daily. 02/14/22   [provider]  cholecalciferol (VITAMIN D) 25 MCG (1000 UNIT) tablet Take 1,000 Units by mouth daily.    [provider]  folic acid (FOLVITE) 400 MCG tablet Take 400 mcg by mouth daily.    [provider]  furosemide (LASIX) 20 MG tablet Take 1 tablet (20 mg total) by mouth daily. 06/20/21 04/27/22  Lewayne Bunting, MD  latanoprost (XALATAN) 0.005 % ophthalmic solution Place 1 drop into both eyes at bedtime. 07/19/21   [provider]  losartan (COZAAR) 100 MG tablet TAKE 1 TABLET BY MOUTH  DAILY 05/11/21   Azalee Course, [place]  Omega-3 Fatty  Acids (FISH OIL PO) Take 1,200 mg by mouth daily.    [provider]  omeprazole (PRILOSEC) 40 MG capsule Take 40 mg by mouth daily. 07/14/21   [provider]  ondansetron (ZOFRAN) 4 MG tablet Take 4 mg by mouth every 12 (twelve) hours as needed for nausea or vomiting.    [provider]  QUEtiapine (SEROQUEL) 25 MG tablet Take 25 mg by mouth at bedtime. 04/18/21   [provider]  sertraline (ZOLOFT) 50 MG tablet Take 75 mg by mouth in the morning. 07/19/21   [provider]  simvastatin (ZOCOR) 40 MG tablet Take 40 mg by mouth at bedtime. 05/17/19   [provider]  timolol (TIMOPTIC) 0.5 % ophthalmic solution Place 1 drop into both eyes 2 (two) times daily. 06/18/19   [provider]  vitamin B-12 (CYANOCOBALAMIN) 1000 MCG tablet Take 1,000 mcg by mouth daily.    [provider]  XARELTO 20 MG TABS tablet Take 1 tablet (20 mg total) by mouth daily with supper. 06/19/21   Lewayne Bunting, MD      Allergies    Amlodipine, Iodine, Cefprozil, Mirabegron, and Peppermint flavor    Review of Systems   Review of Systems  Cardiovascular:  Positive for chest pain.    Physical Exam Updated Vital Signs BP (!) 153/93   Pulse Marland Kitchen)  53   Temp 98.7 F (37.1 C) (Oral)   Resp (!) 21   Ht 5\' 5"  (1.651 m)   Wt 83.9 kg   SpO2 100%   BMI 30.79 kg/m  Physical Exam Vitals and nursing note reviewed.  Cardiovascular:     Rate and Rhythm: Normal rate. Rhythm irregular.  Pulmonary:     Comments: Mildly harsh breath sounds without focal rales or rhonchi. Chest:     Chest wall: No tenderness.  Abdominal:     Tenderness: There is no abdominal tenderness.  Musculoskeletal:     Right lower leg: No tenderness.     Left lower leg: No tenderness.  Skin:    Capillary Refill: Capillary refill takes less than 2 seconds.  Neurological:     Mental Status: She is alert.     ED Results / Procedures / Treatments   Labs (all labs ordered  are listed, but only abnormal results are displayed) Labs Reviewed  BASIC METABOLIC PANEL - Abnormal; Notable for the following components:      Result Value   Sodium 134 (*)    Glucose, Bld 114 (*)    Calcium 8.8 (*)    All other components within normal limits  CBC  TROPONIN I (HIGH SENSITIVITY)  TROPONIN I (HIGH SENSITIVITY)    EKG EKG Interpretation  Date/Time:  Friday Jan 18 2023 15:54:41 EDT Ventricular Rate:  63 PR Interval:    QRS Duration: 96 QT Interval:  397 QTC Calculation: 407 R Axis:   -59 Text Interpretation: Atrial fibrillation Inferior infarct, old Anterior infarct, old Confirmed by Benjiman Core [phone]) on 01/18/2023 4:07:26 PM  Radiology DG Chest Port 1 View  Result Date: 01/18/2023 CLINICAL DATA:  Chest pain. EXAM: PORTABLE CHEST 1 VIEW COMPARISON:  July 22, 2016. FINDINGS: The heart size and mediastinal contours are within normal limits. Both lungs are clear. The visualized skeletal structures are unremarkable. IMPRESSION: No active disease. Aortic Atherosclerosis (ICD10-I70.0). Electronically Signed   By: Lupita Raider M.D.   On: 01/18/2023 16:36    Procedures Procedures    Medications Ordered in ED Medications  albuterol (VENTOLIN HFA) 108 (90 Base) MCG/ACT inhaler 2 puff (2 puffs Inhalation Given 01/18/23 1737)  aerochamber plus with mask device 1 each (1 each Other Given 01/18/23 1737)    ED Course/ Medical Decision Making/ A&P                             Medical Decision Making Amount and/or Complexity of Data Reviewed Labs: ordered. Radiology: ordered.  Risk Prescription drug management.   Patient with shortness of breath and chest pain.  Has had some cough with some sputum production.  Will get x-ray and basic blood work.  Differential diagnosis includes URI, pneumonia, pneumothorax.  X-ray reassuring.  Blood work reassuring.  Able to ambulate without hypoxia, however on reexamination wheezing improved some but did have harsh  breath sounds at bilateral upper lung fields.  With increased sputum production and cough and shortness of breath along with age will treat with antibiotics.  Given inhaler.  Follow-up as an outpatient.        Final Clinical Impression(s) / ED Diagnoses Final diagnoses:  Community acquired pneumonia, unspecified laterality    Rx / DC Orders ED Discharge Orders          Ordered    doxycycline (VIBRAMYCIN) 100 MG capsule  2 times daily        01/18/23  1743              Benjiman Core, MD 01/18/23 2203

## 2023-01-18 NOTE — ED Notes (Signed)
X-ray at bedside

## 2023-01-18 NOTE — ED Notes (Signed)
   01/18/23 1737  Aerosol Therapy Tx  $ Hand Held Nebulizer  1  Medications Albuterol  Delivery Device MDI  Pre-Treatment Pulse 60  Pre-Treatment Respirations 22  Post-Treatment Pulse 57  Post-Treatment Respirations 21  Treatment Tolerance Tolerated well  Treatment Given 1

## 2023-01-25 DIAGNOSIS — I110 Hypertensive heart disease with heart failure: Secondary | ICD-10-CM | POA: Diagnosis not present

## 2023-01-25 DIAGNOSIS — D6869 Other thrombophilia: Secondary | ICD-10-CM | POA: Diagnosis not present

## 2023-01-25 DIAGNOSIS — I4891 Unspecified atrial fibrillation: Secondary | ICD-10-CM | POA: Diagnosis not present

## 2023-01-25 DIAGNOSIS — I5030 Unspecified diastolic (congestive) heart failure: Secondary | ICD-10-CM | POA: Diagnosis not present

## 2023-07-18 ENCOUNTER — Telehealth: Payer: Self-pay | Admitting: Cardiology

## 2023-07-18 NOTE — Telephone Encounter (Signed)
Caller Maurine Minister) stated he will be taking over patient's care and noted patient has been having low BP.  Caller wants to know if it would OK for patient to fly to [place].

## 2023-07-19 NOTE — Telephone Encounter (Signed)
Spoke to [place] about getting BP readings for patient. She states she will have them faxed over and was give fax number.

## 2023-07-19 NOTE — Telephone Encounter (Signed)
Late Entry: Spoke to patient's son who stated he was patient's HPOA. He has concerns about her BP and HR being low. He could not give me exact readings because patient lives in [facility]. Attempted to call [facility] for readings and spoke to the nurse who stated she would call me back with readings. He is wanting to know if it is safe for her to fly with her BP and HR low. Will call [facility] again for readings.

## 2023-07-19 NOTE — Telephone Encounter (Signed)
Called [facility] back concerning getting BP reading with no answer. Left message to return call.

## 2023-07-22 NOTE — Telephone Encounter (Signed)
Called son with no answer. Left message to return call.

## 2023-07-23 NOTE — Telephone Encounter (Signed)
Patients son is returning phone call

## 2023-07-23 NOTE — Telephone Encounter (Signed)
Spoke to [place] patient's son and patient is currently living in [place] with him and his wife. He will be looking for a cardiologist there and requesting medical records.

## 2023-07-24 DIAGNOSIS — N390 Urinary tract infection, site not specified: Secondary | ICD-10-CM | POA: Diagnosis not present
